# Patient Record
Sex: Female | Born: 1970 | Race: Black or African American | Hispanic: No | Marital: Married | Smoking: Never smoker
Health system: Southern US, Community
[De-identification: ages and names within clinical notes are randomized; demographics above are authoritative.]

## PROBLEM LIST (undated history)

## (undated) DIAGNOSIS — E669 Obesity, unspecified: Secondary | ICD-10-CM

## (undated) DIAGNOSIS — F419 Anxiety disorder, unspecified: Secondary | ICD-10-CM

## (undated) DIAGNOSIS — G43909 Migraine, unspecified, not intractable, without status migrainosus: Secondary | ICD-10-CM

## (undated) HISTORY — DX: Anxiety disorder, unspecified: F41.9

## (undated) HISTORY — DX: Obesity, unspecified: E66.9

## (undated) HISTORY — DX: Migraine, unspecified, not intractable, without status migrainosus: G43.909

---

## 1997-05-30 ENCOUNTER — Inpatient Hospital Stay (HOSPITAL_COMMUNITY): Admission: AD | Admit: 1997-05-30 | Discharge: 1997-05-30 | Payer: Self-pay | Admitting: Obstetrics and Gynecology

## 1997-06-09 ENCOUNTER — Inpatient Hospital Stay (HOSPITAL_COMMUNITY): Admission: AD | Admit: 1997-06-09 | Discharge: 1997-06-10 | Payer: Self-pay | Admitting: Obstetrics and Gynecology

## 1998-05-24 ENCOUNTER — Emergency Department (HOSPITAL_COMMUNITY): Admission: EM | Admit: 1998-05-24 | Discharge: 1998-05-24 | Payer: Self-pay | Admitting: Emergency Medicine

## 1998-06-20 ENCOUNTER — Other Ambulatory Visit: Admission: RE | Admit: 1998-06-20 | Discharge: 1998-06-20 | Payer: Self-pay | Admitting: Obstetrics and Gynecology

## 1998-10-25 ENCOUNTER — Other Ambulatory Visit: Admission: RE | Admit: 1998-10-25 | Discharge: 1998-10-25 | Payer: Self-pay | Admitting: Obstetrics and Gynecology

## 1999-08-29 ENCOUNTER — Other Ambulatory Visit: Admission: RE | Admit: 1999-08-29 | Discharge: 1999-08-29 | Payer: Self-pay | Admitting: Obstetrics and Gynecology

## 2000-08-25 ENCOUNTER — Ambulatory Visit (HOSPITAL_COMMUNITY): Admission: RE | Admit: 2000-08-25 | Discharge: 2000-08-25 | Payer: Self-pay | Admitting: Obstetrics and Gynecology

## 2000-08-25 ENCOUNTER — Encounter (INDEPENDENT_AMBULATORY_CARE_PROVIDER_SITE_OTHER): Payer: Self-pay

## 2001-09-08 ENCOUNTER — Ambulatory Visit (HOSPITAL_COMMUNITY): Admission: RE | Admit: 2001-09-08 | Discharge: 2001-09-08 | Payer: Self-pay | Admitting: Obstetrics and Gynecology

## 2001-09-08 ENCOUNTER — Encounter: Payer: Self-pay | Admitting: Obstetrics and Gynecology

## 2001-09-20 ENCOUNTER — Other Ambulatory Visit: Admission: RE | Admit: 2001-09-20 | Discharge: 2001-09-20 | Payer: Self-pay | Admitting: Obstetrics and Gynecology

## 2002-07-05 ENCOUNTER — Inpatient Hospital Stay (HOSPITAL_COMMUNITY): Admission: AD | Admit: 2002-07-05 | Discharge: 2002-07-05 | Payer: Self-pay | Admitting: Obstetrics and Gynecology

## 2002-07-24 ENCOUNTER — Emergency Department (HOSPITAL_COMMUNITY): Admission: EM | Admit: 2002-07-24 | Discharge: 2002-07-24 | Payer: Self-pay | Admitting: Emergency Medicine

## 2002-08-01 ENCOUNTER — Inpatient Hospital Stay (HOSPITAL_COMMUNITY): Admission: AD | Admit: 2002-08-01 | Discharge: 2002-08-01 | Payer: Self-pay | Admitting: *Deleted

## 2002-10-24 ENCOUNTER — Other Ambulatory Visit: Admission: RE | Admit: 2002-10-24 | Discharge: 2002-10-24 | Payer: Self-pay | Admitting: Obstetrics and Gynecology

## 2002-11-03 ENCOUNTER — Inpatient Hospital Stay (HOSPITAL_COMMUNITY): Admission: AD | Admit: 2002-11-03 | Discharge: 2002-11-03 | Payer: Self-pay | Admitting: Obstetrics and Gynecology

## 2003-01-15 ENCOUNTER — Inpatient Hospital Stay (HOSPITAL_COMMUNITY): Admission: AD | Admit: 2003-01-15 | Discharge: 2003-01-15 | Payer: Self-pay | Admitting: Obstetrics and Gynecology

## 2003-02-16 ENCOUNTER — Inpatient Hospital Stay (HOSPITAL_COMMUNITY): Admission: AD | Admit: 2003-02-16 | Discharge: 2003-02-16 | Payer: Self-pay | Admitting: Obstetrics and Gynecology

## 2003-03-26 ENCOUNTER — Inpatient Hospital Stay (HOSPITAL_COMMUNITY): Admission: AD | Admit: 2003-03-26 | Discharge: 2003-04-02 | Payer: Self-pay | Admitting: *Deleted

## 2003-04-01 ENCOUNTER — Encounter (INDEPENDENT_AMBULATORY_CARE_PROVIDER_SITE_OTHER): Payer: Self-pay | Admitting: *Deleted

## 2003-04-03 ENCOUNTER — Encounter: Admission: RE | Admit: 2003-04-03 | Discharge: 2003-05-03 | Payer: Self-pay | Admitting: Obstetrics and Gynecology

## 2003-04-28 HISTORY — PX: VAGINAL HYSTERECTOMY: SUR661

## 2003-05-02 ENCOUNTER — Other Ambulatory Visit: Admission: RE | Admit: 2003-05-02 | Discharge: 2003-05-02 | Payer: Self-pay | Admitting: Obstetrics and Gynecology

## 2003-05-04 ENCOUNTER — Encounter: Admission: RE | Admit: 2003-05-04 | Discharge: 2003-06-03 | Payer: Self-pay | Admitting: Obstetrics and Gynecology

## 2003-06-04 ENCOUNTER — Encounter: Admission: RE | Admit: 2003-06-04 | Discharge: 2003-07-04 | Payer: Self-pay | Admitting: Obstetrics and Gynecology

## 2003-11-17 ENCOUNTER — Inpatient Hospital Stay (HOSPITAL_COMMUNITY): Admission: AD | Admit: 2003-11-17 | Discharge: 2003-11-17 | Payer: Self-pay | Admitting: Obstetrics & Gynecology

## 2004-02-05 ENCOUNTER — Observation Stay (HOSPITAL_COMMUNITY): Admission: RE | Admit: 2004-02-05 | Discharge: 2004-02-06 | Payer: Self-pay | Admitting: Obstetrics and Gynecology

## 2004-02-05 ENCOUNTER — Encounter (INDEPENDENT_AMBULATORY_CARE_PROVIDER_SITE_OTHER): Payer: Self-pay | Admitting: Specialist

## 2004-08-01 ENCOUNTER — Emergency Department (HOSPITAL_COMMUNITY): Admission: EM | Admit: 2004-08-01 | Discharge: 2004-08-01 | Payer: Self-pay | Admitting: Emergency Medicine

## 2004-09-18 ENCOUNTER — Emergency Department (HOSPITAL_COMMUNITY): Admission: EM | Admit: 2004-09-18 | Discharge: 2004-09-18 | Payer: Self-pay | Admitting: Emergency Medicine

## 2005-03-09 ENCOUNTER — Other Ambulatory Visit: Admission: RE | Admit: 2005-03-09 | Discharge: 2005-03-09 | Payer: Self-pay | Admitting: Obstetrics and Gynecology

## 2005-12-04 ENCOUNTER — Encounter: Admission: RE | Admit: 2005-12-04 | Discharge: 2005-12-04 | Payer: Self-pay | Admitting: Obstetrics and Gynecology

## 2005-12-04 IMAGING — MG MM DIAGNOSTIC BILATERAL
2 series · 2 of 2 positions shown · non-contrast
Comparison: none

DG DIAGNOSTIC BILATERAL
Bilateral CC and MLO view(s) were taken.

DIGITAL BILATERAL  DIAGNOSTIC MAMMOGRAM WITH CAD:
CLINICAL DATA: 34-year-old with soreness in the right breast.  This is diffuse pain involving the
lower outer quadrant of the right breast.
Baseline mammogram shows fibroglandular parenchymal pattern.  No mass, architectural distortion or 
suspicious microcalcification is identified.

[R CC]
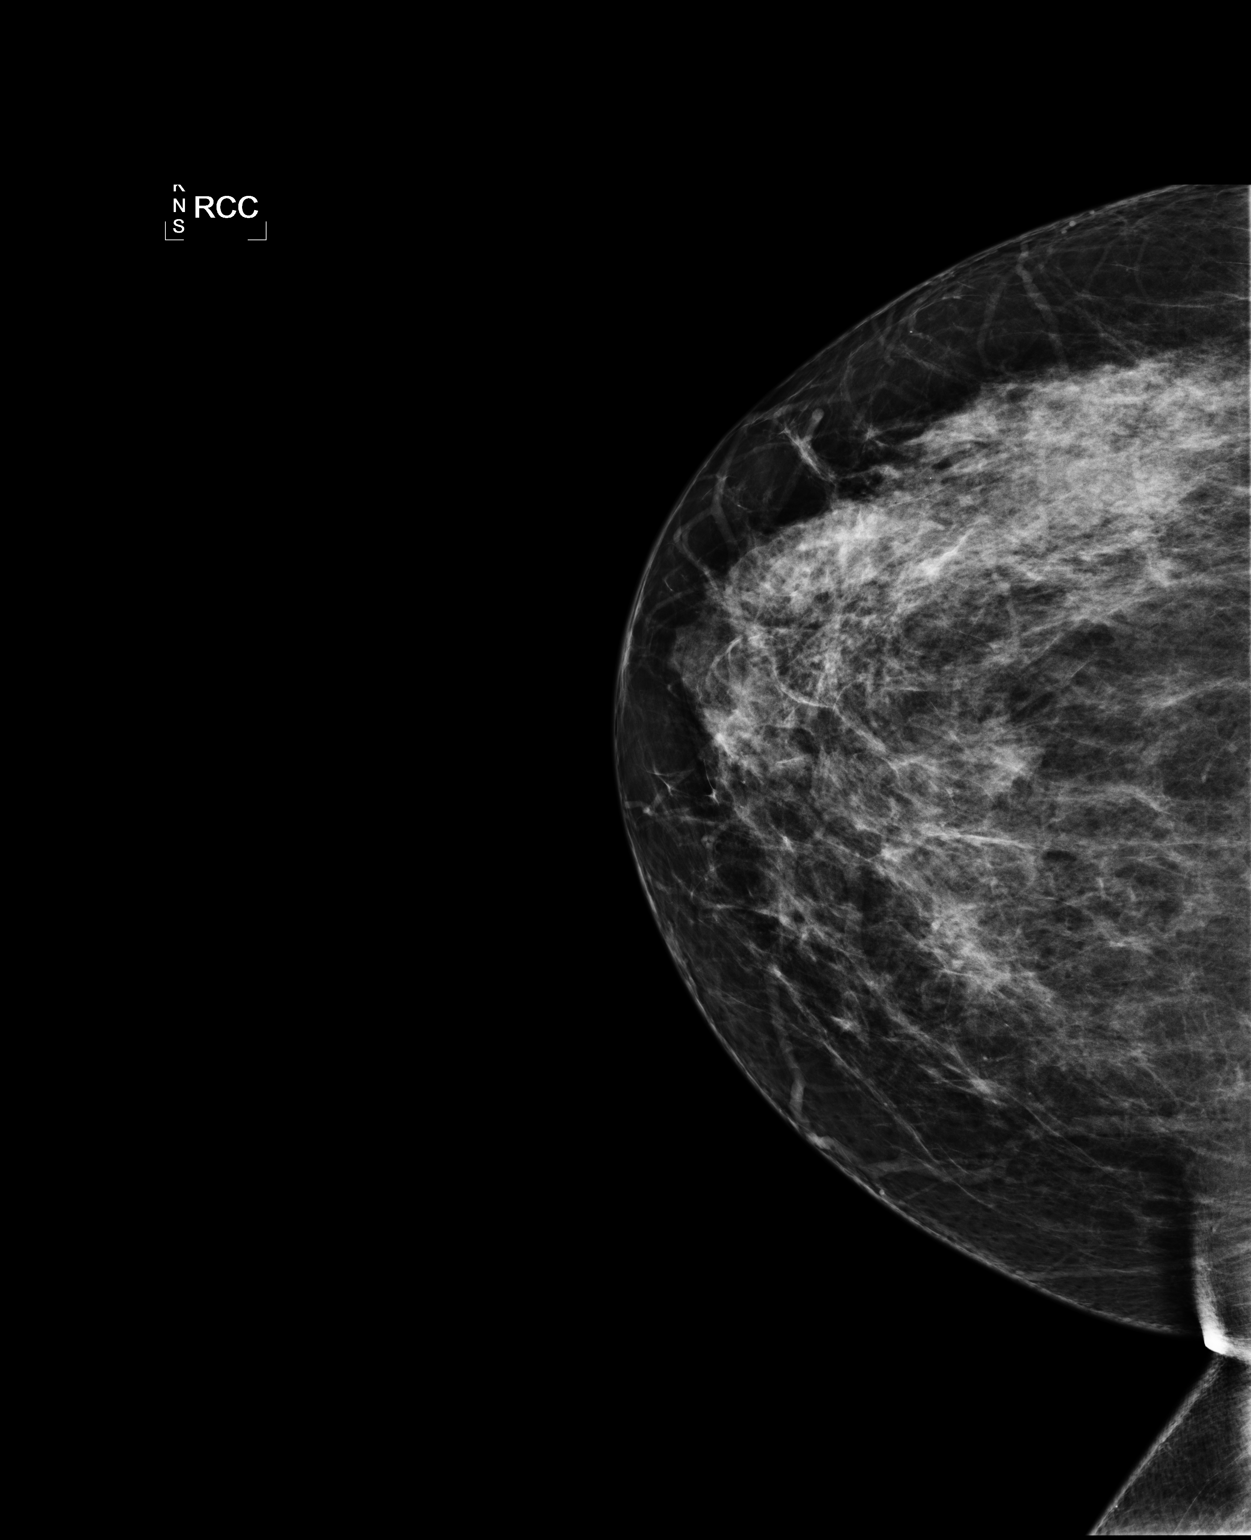

[L MLO]
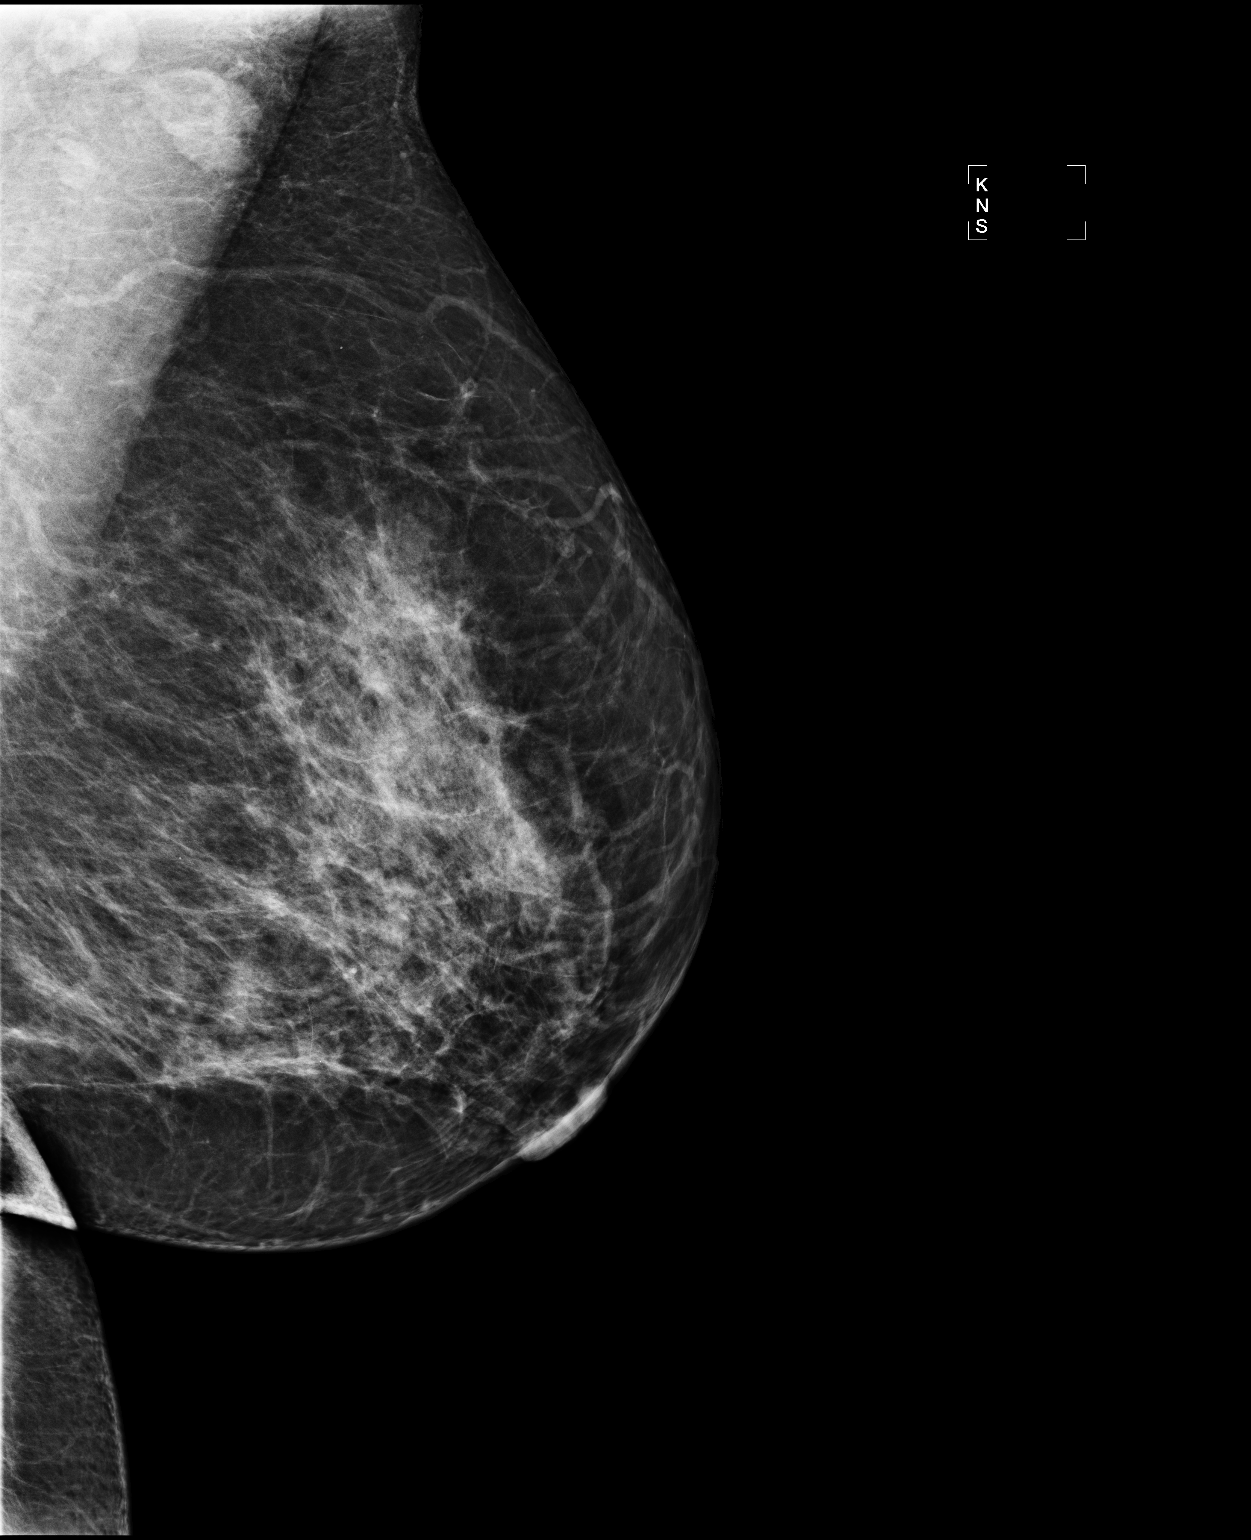

[2 of 2 positions shown; findings below may reference images not displayed]

IMPRESSION: No mammographic evidence for malignancy.  Annual mammography is recommended beginning at age 40.

ASSESSMENT: Negative - BI-RADS 1

Routine screening mammogram at age 39.
, THIS PROCEDURE WAS A DIGITAL MAMMOGRAM

## 2005-12-19 ENCOUNTER — Encounter: Admission: RE | Admit: 2005-12-19 | Discharge: 2005-12-19 | Payer: Self-pay | Admitting: Obstetrics and Gynecology

## 2006-05-26 ENCOUNTER — Encounter: Admission: RE | Admit: 2006-05-26 | Discharge: 2006-05-26 | Payer: Self-pay | Admitting: Obstetrics and Gynecology

## 2006-05-26 ENCOUNTER — Encounter (INDEPENDENT_AMBULATORY_CARE_PROVIDER_SITE_OTHER): Payer: Self-pay | Admitting: *Deleted

## 2006-06-15 ENCOUNTER — Emergency Department (HOSPITAL_COMMUNITY): Admission: EM | Admit: 2006-06-15 | Discharge: 2006-06-15 | Payer: Self-pay | Admitting: Emergency Medicine

## 2008-12-21 ENCOUNTER — Encounter: Admission: RE | Admit: 2008-12-21 | Discharge: 2008-12-21 | Payer: Self-pay | Admitting: Family Medicine

## 2009-10-13 ENCOUNTER — Emergency Department (HOSPITAL_COMMUNITY): Admission: EM | Admit: 2009-10-13 | Discharge: 2009-10-13 | Payer: Self-pay | Admitting: Emergency Medicine

## 2010-05-18 ENCOUNTER — Encounter: Payer: Self-pay | Admitting: Obstetrics and Gynecology

## 2010-07-22 ENCOUNTER — Other Ambulatory Visit: Payer: Self-pay | Admitting: Neurology

## 2010-07-22 ENCOUNTER — Ambulatory Visit
Admission: RE | Admit: 2010-07-22 | Discharge: 2010-07-22 | Disposition: A | Discharge status: Home / Self Care | Payer: PRIVATE HEALTH INSURANCE | Source: Ambulatory Visit | Attending: Neurology | Admitting: Neurology

## 2010-07-22 DIAGNOSIS — R51 Headache: Secondary | ICD-10-CM

## 2010-07-22 MED ORDER — GADOBENATE DIMEGLUMINE 529 MG/ML IV SOLN
15.0000 mL | Freq: Once | INTRAVENOUS | Status: AC | PRN
  Administered 2010-07-22: 15 mL via INTRAVENOUS

## 2010-09-12 NOTE — Discharge Summary (Signed)
Shelby Fields, Shelby Fields                             ACCOUNT NO.:  [id number]   MEDICAL RECORD NO.:  [id number]                   PATIENT TYPE:  INP   LOCATION:  9116                                 FACILITY:  [facility]   PHYSICIAN:  Guy Sandifer. Henderson Cloud, M.D.              DATE OF BIRTH:  Mar 16, 1971   DATE OF ADMISSION:  03/26/2003  DATE OF DISCHARGE:  04/02/2003                                 DISCHARGE SUMMARY   ADMITTING DIAGNOSES:  1. Intrauterine pregnancy at 31-5/7 weeks estimated gestational age.  2. Preterm labor.   DISCHARGE DIAGNOSES:  1. Status post spontaneous vaginal delivery.  2. Viable female infant.   PROCEDURES:  1. Spontaneous vaginal delivery.  2. Dilation and curettage.   REASON FOR ADMISSION:  Please see written H&P.   HOSPITAL COURSE:  Patient was a 40 year old gravida 4 para 1 female that was  admitted to [facility] at 31-5/7 weeks in preterm labor.  Patient was seen in the maternity admissions unit for increase in uterine  contractions which were every 4-5 minutes with change in cervix to 1 cm  dilated.  Terbutaline subcutaneously was administered with oral hydration  which did not change the uterine contraction pattern.  Patient was then  admitted for preterm labor management.  She did have a history of positive  group B beta strep.  Patient was then admitted and magnesium sulfate was  started for tocolysis.  Betamethasone was also administered to enhance fetal  lung maturity.  IV antibiotics were administered, fetal heart tones were  reactive, and magnesium sulfate was continued.  Biophysical profile was  performed which revealed score of 8/8, cervical length was noted to be 3.7  cm, magnesium sulfate was tapered and later that evening spontaneous rupture  of the membranes did occur with clear fluid, fetal heart tones were 140,  baby was noted to be in the vertex presentation, fetal heart tones were  reactive.  Betamethasone was continued and magnesium  sulfate was  discontinued the following morning, IV antibiotics were continued, patient  continued to leak small amount of fluid, she remained afebrile, fetal heart  tones were reactive.  On hospital day #3 patient did notice some increasing  contractions, she was continuing to leak a small amount of fluid,  temperature was noted to be 99.1, however, CBC revealed WBC count of 8.4,  fetal heart tones were reactive, however, there were some small variables  that were noted with contraction, patient was monitored closely for evidence  of chorioamnionitis.  IV antibiotics were continued.  On March 31, 2003  patient did notice some increasing contractions which were noted to be every  4-8 minutes, vaginal exam revealed cervix that was dilated to 4-5 cm,  completely effaced with vertex at a -3 station, and fetal heart tones were  140s with nice acceleration.  That afternoon patient did progress to full  dilation with spontaneous vaginal delivery  at 1:11 p.m., a viable female  infant over an intact perineum, baby did weigh 4 pounds 3 ounces with Apgars  of 7 at one minute, 8 at five minutes.  NICU was in attendance and baby did  well and was taken to the nursery with pediatric team.  Placenta was very  adherent to the uterine wall and was unable to be removed spontaneously,  attempts were made for a manual extraction but it was difficult due to  numerous fibroids, subsequent D&C was performed, patient did tolerate  procedure well and was transferred to the mother-baby unit after she was  stable.  On postpartum day #1 patient was afebrile, fundus was firm and  nontender, she was having a minimal amount of lochia, she was ambulating  well, not complaining of dizziness, hemoglobin was noted to be 5.8, the  patient was started on iron with meals and antibiotics were continued for 24  hours.  On postpartum day #2 patient was without complaints, baby was stable  in the NICU, fundus was firm and  nontender, lochia continued to be small,  vital signs were stable, discharge instructions were reviewed and patient  was discharged home.   CONDITION ON DISCHARGE:  Stable.   DIET:  Regular as tolerated.   ACTIVITY:  No vaginal entry, douches, tampons, or intercourse for 6 weeks.   FOLLOW UP:  She is to follow up in the office in 4-6 weeks for postpartum  exam.  She is to call for temperature greater than 100 degrees, nausea,  vomiting, heavy vaginal bleeding.   DISCHARGE MEDICATIONS:  1. Niferex 150 mg one p.o. daily.  2. Prenatal vitamins one p.o. daily.  3. Colace one p.o. daily p.r.n.  4. Ibuprofen 600 mg every 6 hours p.r.n.  5. Darvocet-N 100 (#20) one p.o. every 4-6 hours p.r.n. pain.     Julio Sicks, N.P.                        Guy Sandifer. Henderson Cloud, M.D.    CC/MEDQ  D:  05/01/2003  T:  05/01/2003  Job:  [id number]

## 2010-09-12 NOTE — H&P (Signed)
[facility]  Patient:    Shelby Fields, Shelby Fields                       MRN: [id number] Adm. Date:  08/25/00 Attending:  Duke Salvia. Marcelle Overlie, M.D.                         History and Physical  CHIEF COMPLAINT:  Menorrhagia and anemia, leiomyomata.  HISTORY OF PRESENT ILLNESS:  A 40 year old G67, P1, A2, who was seen in December 2001 with complaints of heavy irregular bleeding.  She was felt to have leiomyomata at that time and was started on iron along with OCPs to try to manage her bleeding.  Ultrasound done December 2001 confirmed fibroids and follow-up [facility] July 09, 2000, showed a submucous fibroid that appeared to be somewhat pedunculated.  Presents now for hysteroscopy with hysteroscopic resection of the fibroid.  Her hemoglobin has been in the 7.7 range on Hemocyte b.i.d., and she continues to take her OCPs.  PAST MEDICAL HISTORY:  ALLERGIES:  None.  MEDICATIONS:  Yasmin, Hemocyte b.i.d.  OBSTETRICAL HISTORY:  She has had one FAB, one TAB, and a vaginal delivery in 1996.  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  Cryo in 1993.  The last Pap smear May 2001 was normal.  She has not had a mammogram in the past.  PHYSICAL EXAMINATION:  VITAL SIGNS:  Temperature 98.2, blood pressure 120/78.  HEENT:  Unremarkable.  NECK:  Supple without mass.  LUNGS:  Clear.  CARDIOVASCULAR:  Regular rate and rhythm without murmur, rub or gallop.  BREASTS:  Without masses.  ABDOMEN:  Soft, flat, and nontender.  PELVIC:  Vagina and cervix are normal.  Uterus is 10 week size, mid position. Adnexa unremarkable.  IMPRESSION:  Anemia with submucous leiomyomata.  PLAN:  Hysteroscopy with resection of a submucous fibroid.  This procedure including risks of bleeding, infection, anesthesia, organ injury, the possible need for additional surgery or open surgery were discussed with her.  The possibility of transfusion reviewed with her also. DD:   08/24/00 TD:  08/24/00 Job: [id number]

## 2010-09-12 NOTE — Op Note (Signed)
[facility]  Patient:    Shelby Fields, Shelby Fields                        MRN: [id number] Proc. Date: 08/25/00 Adm. Date:  [id number] Disc. Date: [id number] Attending:  Rhina Brackett                           Operative Report  PREOPERATIVE DIAGNOSES: 1. Anemia. 2. Leiomyoma. 3. Abnormal uterine bleeding.  POSTOPERATIVE DIAGNOSES: 1. Anemia. 2. Leiomyoma. 3. Abnormal uterine bleeding.  PROCEDURE:  Diagnostic hysteroscopy, removal of submucous fibroid.  SURGEON:  Duke Salvia. Marcelle Overlie, M.D.  ANESTHESIA:  MAC plus paracervical block.  DESCRIPTION OF PROCEDURE:  The patient was taken to the operating room and after an adequate level of sedation was obtained with the patients legs in stirrups, the perineum and vagina were prepped and draped in the usual manner for hysteroscopy.  The bladder was drained.  Examination under anesthesia was carried out.  The uterus was upper limits of normal size and midposition. Adnexa negative.  Paracervical block was created by infiltrating at 3 and 9 oclock submucosally, 5-7 cc of 1% Xylocaine plain on either side with negative aspiration.  The anterior cervical lip was grasped with the tenaculum.  A portion of a polyp or fibroid could be noted at the os with removal of Laminaria and adequate prep.  This was grasped with a ring forceps and 2.5 to 3.0 cm submucous fibroid was avulsed without difficulty.  The diagnostic hysteroscope under continuous flow was inserted.  There was minimal bleeding.  There were no other abnormalities noted.  A D&C was performed. Minimal tissue was removed.  The scope was reinserted.  No other abnormalities were noted in the cavity.  A _____ cc Foley balloon was positioned and 10-15 cc of saline were placed to provide immediate tamponade even though there was minimal bleeding at that point.  She tolerated this well and went to the recovery room in good condition. DD:  10/12/00 TD:  10/12/00 Job:  [id number]

## 2010-09-12 NOTE — Discharge Summary (Signed)
NAMESIBONEY, REQUEJO                 ACCOUNT NO.:  [id number]   MEDICAL RECORD NO.:  [id number]          PATIENT TYPE:  OBV   LOCATION:  9305                          FACILITY:  [facility]   PHYSICIAN:  Duke Salvia. Marcelle Overlie, M.D.DATE OF BIRTH:  05/12/1970   DATE OF ADMISSION:  02/05/2004  DATE OF DISCHARGE:                                 DISCHARGE SUMMARY   DISCHARGE DIAGNOSES:  1.  Symptomatic leiomyoma.  2.  Laparoscopic-assisted vaginal hysterectomy this admission.   SUMMARY OF THE HISTORY AND PHYSICAL EXAMINATION:  Please see admission H&P  for details.  Briefly, a 40 year old with symptomatic leiomyoma who presents  for hysterectomy secondary to pain and bleeding.   HOSPITAL COURSE:  On February 05, 2004 under general anesthesia the patient  underwent LAVH, removal of a 260 g uterus with an EBL of 350 mL.  Her  ovaries were normal and were conserved.  On postoperative day #1, she  remained afebrile, was tolerating a regular diet.  The catheter had been  removed and she was voiding without difficulty.  Preoperative hemoglobin  11.8, postoperative on October 12 was 9.3.  She was discharged at  approximately 1 p.m., up ambulating without difficulty.   OTHER LABORATORY DATA:  Preoperative CBC:  WBC 5.8, hemoglobin 11.8,  hematocrit 36.5.  Postoperative hemoglobin 9.3.  UA was negative on  admission.  Blood type was A positive, antibody screen was negative.  Pathology report is still pending.   DISPOSITION:  The patient was discharged on Hemocyte once daily and Lortab  elixir 5-15 mL p.o. q.4-6h. p.r.n. pain.  Will return to the office in 1  week.  Advised to report any increased vaginal bleeding, fever over 101,  persistent nausea/vomiting, or urinary complaints.  She was given specific  instructions regarding diet, sex, exercise.   CONDITION:  Good.   ACTIVITY:  Graded increase.      RMH/MEDQ  D:  02/06/2004  T:  02/06/2004  Job:  [id number]

## 2010-09-12 NOTE — Op Note (Signed)
NAMEDEZIRE, TURK                             ACCOUNT NO.:  [id number]   MEDICAL RECORD NO.:  [id number]                   PATIENT TYPE:  INP   LOCATION:  9116                                 [facility]ACILITY:  [facility]   PHYSICIAN:  Dineen Kid. Rana Snare, M.D.                 DATE OF BIRTH:  [birth year]   DATE OF PROCEDURE:  03/31/2003  DATE OF DISCHARGE:                                 OPERATIVE REPORT   DELIVERY AND PROCEDURE NOTE:  Ms. Deveney had presented with preterm labor and  premature rupture of membranes.  She was 32-1/[redacted] weeks pregnant and  progressed to complete and delivered a viable female infant, Apgars 7 and 8.  The NICU team was present.  The cord was clamped, cut, and the infant was  resuscitated by the NICU team and taken to the NICU.  No lacerations were  apparent.  The placenta was very adherent and unable to remove  spontaneously.  Attempts at manual extraction were very difficult due to the  numerous fibroids.  It was dissected with the operator's hands and ring  forceps in fragmented pieces.  This was followed by sharp curettage with the  banjo curette, sounding a normal-appearing endometrium without retained  products of conception, however very difficult to ascertain due to the  numerous fibroids contorting the endometrium.  After it was felt the uterus  was empty by manual exam and also by sounding with a curette, the curette  was removed.  The bleeding was noted to be minimal.  The uterus was massaged  until firm.  Estimated blood loss during the procedure was 600-800 mL.  The  Apgars on the infant were 7 and 8, and the pH arterial is currently pending.                                               Dineen Kid Rana Snare, M.D.    DCL/MEDQ  D:  03/31/2003  T:  03/31/2003  Job:  [id number]

## 2010-09-12 NOTE — H&P (Signed)
NAMETWALA, COLLINGS                 ACCOUNT NO.:  [id number]   MEDICAL RECORD NO.:  [id number]          PATIENT TYPE:  AMB   LOCATION:  SDC                           [facility]ACILITY:  [facility]   PHYSICIAN:  Duke Salvia. Marcelle Overlie, M.D.DATE OF BIRTH:  1970/12/25   DATE OF ADMISSION:  DATE OF DISCHARGE:                                HISTORY & PHYSICAL   DATE OF SCHEDULED SURGERY:  February 05, 2004   CHIEF COMPLAINT:  Symptomatic leiomyoma with menorrhagia and dysmenorrhea.   HISTORY OF PRESENT ILLNESS:  A 40 year old G4 P2 A2 with symptomatic  fibroids primarily consisting of menorrhagia with anemia and dysmenorrhea  presents for a definitive hysterectomy.  She delivered December 2004  prematurely a 4-pound 3-ounce female.  Postpartum, she was placed on OCs but  continued to have problems with anemia, heavy bleeding, and pelvic pain  requiring narcotics, and presents now for definitive hysterectomy.  The  procedure of LAVH discussed with her.  Risks relative to bleeding,  infection, transfusion, the possible need for open or additional surgery,  along with her expected recovery time all reviewed with her, which she  understands and accepts.   PAST MEDICAL HISTORY:  Blood type is A positive.   OBSTETRICAL HISTORY:  She has had one TAB, one SAB, one term delivery in  1996, and one preterm delivery in 2004.   ALLERGIES:  None.   REVIEW OF SYSTEMS:  Significant for cryosurgery in the past for an abnormal  Pap, removal of a breast fibroadenoma at age 43, hysteroscopic resection of  a submucous fibroid in 2002.   PHYSICAL EXAMINATION:  VITAL SIGNS:  Temperature 98.2, blood pressure  120/78.  HEENT:  Unremarkable.  NECK:  Supple without masses.  LUNGS:  Clear.  CARDIOVASCULAR:  Regular rate and rhythm without murmurs, rubs, gallops  noted.  BREASTS:  Without masses.  ABDOMEN:  Soft, flat, and nontender.  PELVIC:  Normal external genitalia, vagina and cervix clear.  Uterus was 6-  to 8-week size,  mid position.  Adnexa negative.  EXTREMITIES AND NEUROLOGIC:  Unremarkable.   Last ultrasound December 11, 2003 showed multiple fibroids. The largest was  5.8 x 5 cm, appeared to be pedunculated.  Several other smaller - 2.8, 2.34,  1.7, and 1.8.   IMPRESSION:  Symptomatic leiomyoma.   PLAN:  LAVH.  Procedure and risks reviewed as above.      RMH/MEDQ  D:  02/01/2004  T:  02/01/2004  Job:  [id number]

## 2010-09-12 NOTE — Op Note (Signed)
Shelby Fields, Shelby Fields                 ACCOUNT NO.:  [id number]   MEDICAL RECORD NO.:  [id number]          PATIENT TYPE:  OBV   LOCATION:  9305                          [facility]ACILITY:  [facility]   PHYSICIAN:  Duke Salvia. Marcelle Overlie, M.D.DATE OF BIRTH:  10/09/1970   DATE OF PROCEDURE:  02/05/2004  DATE OF DISCHARGE:                                 OPERATIVE REPORT   PREOPERATIVE DIAGNOSES:  Symptomatic leiomyoma.   POSTOPERATIVE DIAGNOSES:  Symptomatic leiomyoma.   PROCEDURE:  Laparoscopically assisted vaginal hysterectomy.   SURGEON:  Duke Salvia. Marcelle Overlie, M.D.   ASSISTANT:  Dineen Kid. Rana Snare, M.D.   ANESTHESIA:  General endotracheal.   COMPLICATIONS:  None.   DRAINS:  Foley catheter.   ESTIMATED BLOOD LOSS:  350.   FINDINGS:  The patient was taken to the operating room and after an adequate  level of general endotracheal anesthesia was obtained with the patient's  legs in stirrups, the abdomen, perineum and vagina were prepped and draped  in the usual manner for LAVH.  After the bladder was drained, EUA carried  out revealing the uterus to be 10 week size, mobile, irregular to the left.  Attention direct to the abdomen, the subumbilical area was infiltrated with  0.5% Marcaine plain. A small incision was made, Veress needle was introduced  without difficulty. Its intraabdominal position was verified by pressure and  water testing. After a 2.5 liter pneumoperitoneum was then created, the  laparoscopic trocar and sleeve were then introduced without difficulty.  There was no evidence of any bleeding or trauma.  Two fingerbreadths below  the symphysis in the midline, a 5 mm bladeless trocar was inserted under  direct visualization. A third 5 mm after negative transillumination was  inserted into the left lower quadrant.   FINDINGS:  The uterus was itself was enlarged approximately 10 week size  with several irregular fibroids, adnexa unremarkable on either side. There  was a pedunculated 5 cm left  fundal fibroid with some adhesions between the  sigmoid mesentery and the fibroid. There was no bowel involved in this area.  After noting these findings, a grasper with a tooth was used to grasp the  pedunculated fibroid which was placed on traction toward the midline.  Careful inspection of the mesenteric adhesions revealed no bowel in this  area. The gyrus tripolar was then used to coagulate and cut the omentum free  from the pedunculated fibroid right on the surface of the fibroid. When this  was free up it was left to its left fundal attachment to remove vaginally.  The gyrus was then used to coagulate and cut the uteroovarian pedicles down  to and including the round ligament on either side with excellent  hemostasis.  After this was completed, the vagina portion of the procedure  was started.   The patient's legs were extended, cervix grasped with a tenaculum,  cervicovaginal mucosa with ties with a scalpel. Posterior culdotomy  performed without difficulty. Starting on the left, the uterosacral ligament  was clamped, divided and suture ligated with #0 Vicryl and held temporarily  for identification. The bladder was  advanced superiorly with sharp and blunt  dissection. The peritoneum could be then identified was entered sharply and  the retractor was positioned to elevate the bladder gently out of the field.  In sequential manner, the cardinal ligament, uterine vasculature pedicles  and upper broad ligament pedicles were clamped, coagulated and cut with the  gyrus instrument. The fundus of the uterus was then delivered posteriorly.  The remaining small pedicles were clamped, divided and free tied with #0  Vicryl sutures. The uterus with the attached pedunculated fibroid was then  removed. The cuff was closed with a running locked 2-0 Vicryl suture from 3  to 9 o'clock. All major pedicles were inspected and noted to be hemostatic.  A McCall's culdoplasty suture was then placed from  the uterosacral on the  left picking up posterior peritoneum across to the right uterosacral  ligament and tied down.  Prior to closure, sponge, needle and instrument  counts were reported as correct x2.  The mucosa was then closed with  interrupted 2-0 Monocryl sutures with excellent hemostasis. The bladder was  drained at that point with a Foley draining clear urine. Attention directed  to the laparoscopy, the pelvis was reinsufflated, irrigated and aspirated.  All operative sites were inspected and noted to be hemostatic. Gas pressure  was let down to a pressure of 5 and further inspection revealed excellent  hemostasis. She tolerated this well. The instruments were removed, gas  allowed to escape, deep fascia closed with 4-0 Dexon subcuticular sutures  and Dermabond.      RMH/MEDQ  D:  02/05/2004  T:  02/05/2004  Job:  [id number]

## 2014-12-12 ENCOUNTER — Other Ambulatory Visit: Payer: Self-pay | Admitting: Obstetrics and Gynecology

## 2014-12-13 LAB — CYTOLOGY - PAP

## 2016-04-08 ENCOUNTER — Ambulatory Visit (INDEPENDENT_AMBULATORY_CARE_PROVIDER_SITE_OTHER): Payer: PRIVATE HEALTH INSURANCE | Admitting: Neurology

## 2016-04-08 ENCOUNTER — Encounter: Payer: Self-pay | Admitting: Neurology

## 2016-04-08 VITALS — BP 143/94 | HR 83 | Ht 61.5 in | Wt 197.8 lb

## 2016-04-08 DIAGNOSIS — R930 Abnormal findings on diagnostic imaging of skull and head, not elsewhere classified: Secondary | ICD-10-CM

## 2016-04-08 DIAGNOSIS — R9082 White matter disease, unspecified: Secondary | ICD-10-CM

## 2016-04-08 DIAGNOSIS — G43009 Migraine without aura, not intractable, without status migrainosus: Secondary | ICD-10-CM

## 2016-04-08 MED ORDER — PROPRANOLOL HCL ER 60 MG PO CP24: 60.0000 mg | ORAL_CAPSULE | Freq: Every day | ORAL | 6 refills | Status: DC

## 2016-04-08 NOTE — Progress Notes (Signed)
[facility]    Provider:  Dr Jaynee Eagles Referring Provider: Michel Harrow, PA-C Primary Care Physician:   Michel Harrow, PA-C  CC:  Worsening headaches  HPI:  Shelby Fields is a 45 y.o. female here as a referral from Dr. Dimas Aguas for headaches. She has a Hx of migraines. Sjhe is on topamax and effexor. Recently worsened. Within the last month worsening, no inciting events to worsening. Headaches are pounding in the forehead across the front. She has had daily headaches in the setting of elevated blood pressure ([id number] systolic) but may be due to the headache pain as opposed to hypertension. The headaches develop later in the day. She has nausea, no vomiting. She has had light sensitivity. Pounding and throbbing can be severe a 10/10 she has had to leave work and get a toradol injection and phenergan. One migraine can last all day. She tried imitrex in the past and it made her sick. Can't remember if it worked. She has gained a lot of weight. She has dizziness and lightheadedness associated with the headaches. She snores heavily. Previous to this month was having 4 headache days a month, so this month is significantly worsened. No new medications, no changes in sleeping or work habits, no known food or other triggers, ibuprofen helps but does not relieve the headache. Quiet room helps. No vision changes. No aura. No other focal neurologic deficits, associated symptoms, modifiable factors.  Reviewed notes, labs and imaging from outside physicians, which showed: Reviewed notes from Dr. love who is a neurologist she saw in 2012. At that time she was seen for headaches. In 2007 and MRI of the brain showed a single white matter lesion in the right parietal region which did not enhance, neurologic exam was normal but lesion was concerning for remote embolic stroke or possibly a demyelinating disorder. RPR, sedimentation rate and carotid study of the Dopplers were negative in 2012. At the  time she was complaining of numbness in her left hand and in her left foot. Her right side was normal. She was having headaches in the posterior head region and neck pain 5 days per week. She describes the pain as 7 out of 10 in severity. She noted nausea without vomiting with her headaches. No visual changes with the headaches. She denied Lhermitte sign, bowel or bladder dysfunction, or visual disturbances. Repeat MRI of the brain continued to show the abnormality measuring 6.6 mm in the right posterior frontal lobe, bilateral maxillary sinuses, and hypoplastic posterior circulation disease normal variant. In addition to the headache pain she had dizzy spells lasting up to 5 minutes occurring as often as 2 times per week mostly in the sitting position described as spinning without nausea and vomiting.  Personally reviewed MRI of the brain imaging, there is a 6x6 millimeter right posterior frontal, subcortical focus of T2 FLAIR hyperintensity. It did not enhance. Considerations include autoimmune, inflammatory, postinfectious, chronic ischemic, migraine associated or idiopathic etiology.  Review of Systems: Patient complains of symptoms per HPI as well as the following symptoms:no cp, no sob. Endorses headache, numbness, shortness of breath. Pertinent negatives per HPI. All others negative.   Social History   Social History  . Marital status: Married    Spouse name: Berneta Sages  . Number of children: 2  . Years of education: 14   Occupational History  . Nurse- LPN    Social History Main Topics  . Smoking status: Never Smoker  . Smokeless tobacco: Never Used  . Alcohol  use Yes     Comment: Socially  . Drug use: No  . Sexual activity: Not on file   Other Topics Concern  . Not on file   Social History Narrative   Lives with husband and 2 kids   Caffeine use: Tea/soda- occass       Family History  Problem Relation Age of Onset  . Anemia Mother   . Bladder Cancer Father   . Asthma  Brother   . Ovarian cancer Paternal Grandmother     Past Medical History:  Diagnosis Date  . Anxiety   . Migraine   . Obesity     Past Surgical History:  Procedure Laterality Date  . VAGINAL HYSTERECTOMY  2005   Still has ovaries    Current Outpatient Prescriptions  Medication Sig Dispense Refill  . Cholecalciferol (VITAMIN D PO) Take 50,000 Units by mouth once a week.    Marland Kitchen ibuprofen (ADVIL,MOTRIN) 800 MG tablet Take 800 mg by mouth 3 (three) times daily as needed.    . Multiple Vitamins-Minerals (MULTIVITAMIN PO) Take 1 tablet by mouth daily.    . ondansetron (ZOFRAN) 4 MG tablet Take 4 mg by mouth every 8 (eight) hours as needed for nausea or vomiting.    . topiramate (TOPAMAX) 100 MG tablet Take 100 mg by mouth daily.    Marland Kitchen venlafaxine XR (EFFEXOR-XR) 150 MG 24 hr capsule Take 150 mg by mouth daily with breakfast.    . propranolol ER (INDERAL LA) 60 MG 24 hr capsule Take 1 capsule (60 mg total) by mouth at bedtime. 30 capsule 6   No current facility-administered medications for this visit.     Allergies as of 04/08/2016  . (No Known Allergies)    Vitals: BP (!) 143/94 (BP Location: Right Arm, Patient Position: Sitting, Cuff Size: Large)   Pulse 83   Ht 5' 1.5" (1.562 m)   Wt 197 lb 12.8 oz (89.7 kg)   BMI 36.77 kg/m  Last Weight:  Wt Readings from Last 1 Encounters:  04/08/16 197 lb 12.8 oz (89.7 kg)   Last Height:   Ht Readings from Last 1 Encounters:  04/08/16 5' 1.5" (1.562 m)   Physical exam: Exam: Gen: NAD, conversant, well nourised, obese, well groomed                     CV: RRR, no MRG. No Carotid Bruits. No peripheral edema, warm, nontender Eyes: Conjunctivae clear without exudates or hemorrhage  Neuro: Detailed Neurologic Exam  Speech:    Speech is normal; fluent and spontaneous with normal comprehension.  Cognition:    The patient is oriented to person, place, and time;     recent and remote memory intact;     language fluent;     normal  attention, concentration,     fund of knowledge Cranial Nerves:    The pupils are equal, round, and reactive to light. The fundi are normal and spontaneous venous pulsations are present. Visual fields are full to finger confrontation. Extraocular movements are intact. Trigeminal sensation is intact and the muscles of mastication are normal. The face is symmetric. The palate elevates in the midline. Hearing intact. Voice is normal. Shoulder shrug is normal. The tongue has normal motion without fasciculations.   Coordination:    Normal finger to nose and heel to shin. Normal rapid alternating movements.   Gait:    Heel-toe and tandem gait are normal.   Motor Observation:    No asymmetry, no  atrophy, and no involuntary movements noted. Tone:    Normal muscle tone.    Posture:    Posture is normal. normal erect    Strength:    Strength is V/V in the upper and lower limbs.      Sensation: intact to LT     Reflex Exam:  DTR's:    Deep tendon reflexes in the upper and lower extremities are normal bilaterally.   Toes:    The toes are downgoing bilaterally.   Clonus:    Clonus is absent.       Assessment/Plan:  45 year old female with chronic migraine disorder and worsening headaches over the last month with have been severe. 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 Patient has not had reevaluation since 2012. Feel we should reevaluate MRI of the brain to follow this lesion and ensure that there has been no worsening or enlargement.  - start propranolol for migraines. Continue Topamax and Effexor. Zofran for nausea. BMP today.  - repeat MRI of the brain, see above for differential - Discussed: To prevent or relieve headaches, try the following: Cool Compress. Lie down and place a cool compress on your head.  Avoid headache triggers. If certain foods or odors seem  to have triggered your migraines in the past, avoid them. A headache diary might help you identify triggers.  Include physical activity in your daily routine. Try a daily walk or other moderate aerobic exercise.  Manage stress. Find healthy ways to cope with the stressors, such as delegating tasks on your to-do list.  Practice relaxation techniques. Try deep breathing, yoga, massage and visualization.  Eat regularly. Eating regularly scheduled meals and maintaining a healthy diet might help prevent headaches. Also, drink plenty of fluids.  Follow a regular sleep schedule. Sleep deprivation might contribute to headaches Consider biofeedback. With this mind-body technique, you learn to control certain bodily functions - such as muscle tension, heart rate and blood pressure - to prevent headaches or reduce headache pain.    Proceed to emergency room if you experience new or worsening symptoms or symptoms do not resolve, if you have new neurologic symptoms or if headache is severe, or for any concerning symptom.   Cc: v  Sarina Ill, MD  [facility] [address] [place], [place] [address]  Phone [phone] Fax [phone]

## 2016-04-08 NOTE — Patient Instructions (Addendum)
Remember to drink plenty of fluid, eat healthy meals and do not skip any meals. Try to eat protein with a every meal and eat a healthy snack such as fruit or nuts in between meals. Try to keep a regular sleep-wake schedule and try to exercise daily, particularly in the form of walking, 20-30 minutes a day, if you can.   As far as your medications are concerned, I would like to suggest: Propranolol 60mg  at night  As far as diagnostic testing: MRI brain w/wo contrast   Our phone number is [phone]. We also have an after hours call service for urgent matters and there is a physician on-call for urgent questions. For any emergencies you know to call 911 or go to the nearest emergency room  Propranolol extended-release capsules What is this medicine? PROPRANOLOL (proe PRAN oh lole) is a beta-blocker. Beta-blockers reduce the workload on the heart and help it to beat more regularly. This medicine is used to treat high blood pressure, heart muscle disease, and prevent chest pain caused by angina. It is also used to prevent migraine headaches. You should not use this medicine to treat a migraine that has already started. This medicine may be used for other purposes; ask your health care provider or pharmacist if you have questions. COMMON BRAND NAME(S): Inderal LA, Inderal XL, InnoPran XL What should I tell my health care provider before I take this medicine? They need to know if you have any of these conditions: -circulation problems, or blood vessel disease -diabetes -history of heart attack or heart disease, vasospastic angina -kidney disease -liver disease -lung or breathing disease, like asthma or emphysema -pheochromocytoma -slow heart rate -thyroid disease -an unusual or allergic reaction to propranolol, other beta-blockers, medicines, foods, dyes, or preservatives -pregnant or trying to get pregnant -breast-feeding How should I use this medicine? Take this medicine by mouth with a  glass of water. Follow the directions on the prescription label. Do not crush or chew. Take your doses at regular intervals. Do not take your medicine more often than directed. Do not stop taking except on the advice of your doctor or health care professional. Talk to your pediatrician regarding the use of this medicine in children. Special care may be needed. Overdosage: If you think you have taken too much of this medicine contact a poison control center or emergency room at once. NOTE: This medicine is only for you. Do not share this medicine with others. What if I miss a dose? If you miss a dose, take it as soon as you can. If it is almost time for your next dose, take only that dose. Do not take double or extra doses. What may interact with this medicine? Do not take this medicine with any of the following medications: -feverfew -phenothiazines like chlorpromazine, mesoridazine, prochlorperazine, thioridazine This medicine may also interact with the following medications: -aluminum hydroxide gel -antipyrine -antiviral medicines for HIV or AIDS -barbiturates like phenobarbital -certain medicines for blood pressure, heart disease, irregular heart beat -cimetidine -ciprofloxacin -diazepam -fluconazole -haloperidol -isoniazid -medicines for cholesterol like cholestyramine or colestipol -medicines for mental depression -medicines for migraine headache like almotriptan, eletriptan, frovatriptan, naratriptan, rizatriptan, sumatriptan, zolmitriptan -NSAIDs, medicines for pain and inflammation, like ibuprofen or naproxen -phenytoin -rifampin -teniposide -theophylline -thyroid medicines -tolbutamide -warfarin -zileuton This list may not describe all possible interactions. Give your health care provider a list of all the medicines, herbs, non-prescription drugs, or dietary supplements you use. Also tell them if you smoke, drink alcohol, or use  illegal drugs. Some items may interact with  your medicine. What should I watch for while using this medicine? Visit your doctor or health care professional for regular check ups. Contact your doctor right away if your symptoms worsen. Check your blood pressure and pulse rate regularly. Ask your health care professional what your blood pressure and pulse rate should be, and when you should contact them. Do not stop taking this medicine suddenly. This could lead to serious heart-related effects. You may get drowsy or dizzy. Do not drive, use machinery, or do anything that needs mental alertness until you know how this drug affects you. Do not stand or sit up quickly, especially if you are an older patient. This reduces the risk of dizzy or fainting spells. Alcohol can make you more drowsy and dizzy. Avoid alcoholic drinks. This medicine can affect blood sugar levels. If you have diabetes, check with your doctor or health care professional before you change your diet or the dose of your diabetic medicine. Do not treat yourself for coughs, colds, or pain while you are taking this medicine without asking your doctor or health care professional for advice. Some ingredients may increase your blood pressure. What side effects may I notice from receiving this medicine? Side effects that you should report to your doctor or health care professional as soon as possible: -allergic reactions like skin rash, itching or hives, swelling of the face, lips, or tongue -breathing problems -changes in blood sugar -cold hands or feet -difficulty sleeping, nightmares -dry peeling skin -hallucinations -muscle cramps or weakness -slow heart rate -swelling of the legs and ankles -vomiting Side effects that usually do not require medical attention (report to your doctor or health care professional if they continue or are bothersome): -change in sex drive or performance -diarrhea -dry sore eyes -hair loss -nausea -weak or tired This list may not describe all  possible side effects. Call your doctor for medical advice about side effects. You may report side effects to FDA at 1-800-FDA-1088. Where should I keep my medicine? Keep out of the reach of children. Store at room temperature between 15 and 30 degrees C (59 and 86 degrees F). Protect from light, moisture and freezing. Keep container tightly closed. Throw away any unused medicine after the expiration date. NOTE: This sheet is a summary. It may not cover all possible information. If you have questions about this medicine, talk to your doctor, pharmacist, or health care provider.  2017 Elsevier/Gold Standard (2012-12-16 14:58:56)

## 2016-04-09 ENCOUNTER — Telehealth: Payer: Self-pay | Admitting: *Deleted

## 2016-04-09 LAB — BASIC METABOLIC PANEL
BUN/Creatinine Ratio: 15 (ref 9–23)
BUN: 17 mg/dL (ref 6–24)
CO2: 22 mmol/L (ref 18–29)
Calcium: 10.2 mg/dL (ref 8.7–10.2)
Chloride: 104 mmol/L (ref 96–106)
Creatinine, Ser: 1.16 mg/dL — ABNORMAL HIGH (ref 0.57–1.00)
GFR calc Af Amer: 65 mL/min/{1.73_m2} (ref >59)
GFR calc non Af Amer: 57 mL/min/{1.73_m2} — ABNORMAL LOW (ref >59)
Glucose: 80 mg/dL (ref 65–99)
Potassium: 4.8 mmol/L (ref 3.5–5.2)
Sodium: 144 mmol/L (ref 134–144)

## 2016-04-09 NOTE — Telephone Encounter (Signed)
Called and spoke with pt about labs per AA,MD note. Advised she should f/u with PCP. She states she follows with Dr Kathyrn Lass at [facility]. Advised I will send copy to them for their records. She verbalized understanding.  Send copy via EPIC to PCP.

## 2016-04-09 NOTE — Telephone Encounter (Signed)
-----   Message from Melvenia Beam, MD sent at 04/09/2016 11:07 AM EST ----- Creatinine is  Slightly elevated 1.16, but not sure what her baseline is. She should follow up with primary care for evaluation of causes thanks

## 2016-04-12 DIAGNOSIS — R9082 White matter disease, unspecified: Secondary | ICD-10-CM | POA: Insufficient documentation

## 2016-04-12 DIAGNOSIS — G43009 Migraine without aura, not intractable, without status migrainosus: Secondary | ICD-10-CM | POA: Insufficient documentation

## 2016-07-09 ENCOUNTER — Other Ambulatory Visit: Payer: Self-pay | Admitting: Neurology

## 2016-07-09 ENCOUNTER — Telehealth: Payer: Self-pay | Admitting: Neurology

## 2016-07-09 DIAGNOSIS — R519 Headache, unspecified: Secondary | ICD-10-CM

## 2016-07-09 DIAGNOSIS — R51 Headache: Secondary | ICD-10-CM

## 2016-07-09 DIAGNOSIS — R0683 Snoring: Secondary | ICD-10-CM

## 2016-07-09 NOTE — Telephone Encounter (Signed)
Pt called in stating she would like to be set up for a sleep study.  Even with 8 hours of sleep she is still feeling exhausted throughout the day.

## 2016-07-09 NOTE — Telephone Encounter (Signed)
I did not specifically address this with her last time but I think that's fine, she is obese and snores tremendously and has headaches. I will send in the sleep consult and request Dr. Brett Fairy thanks

## 2016-07-10 NOTE — Telephone Encounter (Signed)
Pt called back this morning. Msg relayed that Dr Jaynee Eagles is referring her for sleep consult, she understood and will call back around Wednesday to speak with Vaughan Basta if she has not been contacted.

## 2016-07-20 ENCOUNTER — Ambulatory Visit (INDEPENDENT_AMBULATORY_CARE_PROVIDER_SITE_OTHER): Payer: PRIVATE HEALTH INSURANCE | Admitting: Neurology

## 2016-07-20 ENCOUNTER — Encounter: Payer: Self-pay | Admitting: Neurology

## 2016-07-20 VITALS — BP 124/88 | HR 72 | Ht 61.5 in | Wt 197.4 lb

## 2016-07-20 DIAGNOSIS — R930 Abnormal findings on diagnostic imaging of skull and head, not elsewhere classified: Secondary | ICD-10-CM | POA: Diagnosis not present

## 2016-07-20 DIAGNOSIS — C719 Malignant neoplasm of brain, unspecified: Secondary | ICD-10-CM

## 2016-07-20 DIAGNOSIS — R0683 Snoring: Secondary | ICD-10-CM | POA: Diagnosis not present

## 2016-07-20 DIAGNOSIS — G4719 Other hypersomnia: Secondary | ICD-10-CM

## 2016-07-20 DIAGNOSIS — R9082 White matter disease, unspecified: Secondary | ICD-10-CM

## 2016-07-20 DIAGNOSIS — G43009 Migraine without aura, not intractable, without status migrainosus: Secondary | ICD-10-CM | POA: Diagnosis not present

## 2016-07-20 MED ORDER — PROPRANOLOL HCL ER 60 MG PO CP24: 60.0000 mg | ORAL_CAPSULE | Freq: Every day | ORAL | 4 refills | Status: DC

## 2016-07-20 MED ORDER — TOPIRAMATE 100 MG PO TABS: 100.0000 mg | ORAL_TABLET | Freq: Every day | ORAL | 4 refills | Status: DC

## 2016-07-20 MED ORDER — ELETRIPTAN HYDROBROMIDE 40 MG PO TABS: 40.0000 mg | ORAL_TABLET | ORAL | 11 refills | Status: DC | PRN

## 2016-07-20 MED ORDER — ALPRAZOLAM 0.5 MG PO TABS: ORAL_TABLET | ORAL | 0 refills | Status: DC

## 2016-07-20 MED ORDER — VENLAFAXINE HCL ER 150 MG PO CP24: 150.0000 mg | ORAL_CAPSULE | Freq: Every day | ORAL | 4 refills | Status: DC

## 2016-07-20 NOTE — Progress Notes (Signed)
[facility]    Provider:  Dr Jaynee Eagles Referring Provider: No ref. provider found Primary Care Physician:  Tawanna Solo, MD   Interval history 07/20/2016: She snores a lot. She gets 8 hours of sleep and is still exhausted. The kids record her snoring. Morning headaches, wakes with headaches. The propranolol helped with the migraines. Need to repeat MRI brain due to lesion seen in 2012. She has a sleep evaluation scheduled. Headaches improved but still with morning headaches.  HPI:  Shelby Fields is a 46 y.o. female here as a referral from Dr. Dimas Aguas for headaches. She has a Hx of migraines. Sjhe is on topamax and effexor. Recently worsened. Within the last month worsening, no inciting events to worsening. Headaches are pounding in the forehead across the front. She has had daily headaches in the setting of elevated blood pressure (694H systolic) but may be due to the headache pain as opposed to hypertension. The headaches develop later in the day. She has nausea, no vomiting. She has had light sensitivity. Pounding and throbbing can be severe a 10/10 she has had to leave work and get a toradol injection and phenergan. One migraine can last all day. She tried imitrex in the past and it made her sick. Can't remember if it worked. She has gained a lot of weight. She has dizziness and lightheadedness associated with the headaches. She snores heavily. Previous to this month was having 4 headache days a month, so this month is significantly worsened. No new medications, no changes in sleeping or work habits, no known food or other triggers, ibuprofen helps but does not relieve the headache. Quiet room helps. No vision changes. No aura. No other focal neurologic deficits, associated symptoms, modifiable factors.  Reviewed notes, labs and imaging from outside physicians, which showed: Reviewed notes from Dr. love who is a neurologist she saw in 2012. At that time she was seen for headaches.  In 2007 and MRI of the brain showed a single white matter lesion in the right parietal region which did not enhance, neurologic exam was normal but lesion was concerning for remote embolic stroke or possibly a demyelinating disorder. RPR, sedimentation rate and carotid study of the Dopplers were negative in 2012. At the time she was complaining of numbness in her left hand and in her left foot. Her right side was normal. She was having headaches in the posterior head region and neck pain 5 days per week. She describes the pain as 7 out of 10 in severity. She noted nausea without vomiting with her headaches. No visual changes with the headaches. She denied Lhermitte sign, bowel or bladder dysfunction, or visual disturbances. Repeat MRI of the brain continued to show the abnormality measuring 6.6 mm in the right posterior frontal lobe, bilateral maxillary sinuses, and hypoplastic posterior circulation disease normal variant. In addition to the headache pain she had dizzy spells lasting up to 5 minutes occurring as often as 2 times per week mostly in the sitting position described as spinning without nausea and vomiting.  Personally reviewed MRI of the brain imaging, there is a 6x6 millimeter right posterior frontal, subcortical focus of T2 FLAIR hyperintensity. It did not enhance. Considerations include autoimmune, inflammatory, postinfectious, chronic ischemic, migraine associated or idiopathic etiology.  Review of Systems: Patient complains of symptoms per HPI as well as the following symptoms:no cp, no sob. Endorses headache, numbness, shortness of breath. Pertinent negatives per HPI. All others negative.   Social History   Social History  .  Marital status: Married    Spouse name: Berneta Sages  . Number of children: 2  . Years of education: 14   Occupational History  . Nurse- LPN    Social History Main Topics  . Smoking status: Never Smoker  . Smokeless tobacco: Never Used  . Alcohol use Yes      Comment: Socially  . Drug use: No  . Sexual activity: Not on file   Other Topics Concern  . Not on file   Social History Narrative   Lives with husband and 2 kids   Caffeine use: Tea/soda- occas   Right-handed       Family History  Problem Relation Age of Onset  . Anemia Mother   . Bladder Cancer Father   . Asthma Brother   . Ovarian cancer Paternal Grandmother     Past Medical History:  Diagnosis Date  . Anxiety   . Migraine   . Obesity     Past Surgical History:  Procedure Laterality Date  . VAGINAL HYSTERECTOMY  2005   Still has ovaries    Current Outpatient Prescriptions  Medication Sig Dispense Refill  . Cholecalciferol (VITAMIN D PO) Take 50,000 Units by mouth once a week.    Marland Kitchen ibuprofen (ADVIL,MOTRIN) 800 MG tablet Take 800 mg by mouth 3 (three) times daily as needed.    . Multiple Vitamins-Minerals (MULTIVITAMIN PO) Take 1 tablet by mouth daily.    . ondansetron (ZOFRAN) 4 MG tablet Take 4 mg by mouth every 8 (eight) hours as needed for nausea or vomiting.    . propranolol ER (INDERAL LA) 60 MG 24 hr capsule Take 1 capsule (60 mg total) by mouth at bedtime. 30 capsule 6  . topiramate (TOPAMAX) 100 MG tablet Take 100 mg by mouth daily.    Marland Kitchen venlafaxine XR (EFFEXOR-XR) 150 MG 24 hr capsule Take 150 mg by mouth daily with breakfast.     No current facility-administered medications for this visit.     Allergies as of 07/20/2016  . (No Known Allergies)    Vitals: BP 124/88   Pulse 72   Ht 5' 1.5" (1.562 m)   Wt 197 lb 6.4 oz (89.5 kg)   BMI 36.69 kg/m  Last Weight:  Wt Readings from Last 1 Encounters:  07/20/16 197 lb 6.4 oz (89.5 kg)   Last Height:   Ht Readings from Last 1 Encounters:  07/20/16 5' 1.5" (1.562 m)   Physical exam: Exam: Gen: NAD, conversant, well nourised, obese, well groomed                     CV: RRR, no MRG. No Carotid Bruits. No peripheral edema, warm, nontender Eyes: Conjunctivae clear without exudates or  hemorrhage  Neuro: Detailed Neurologic Exam  Speech:    Speech is normal; fluent and spontaneous with normal comprehension.  Cognition:    The patient is oriented to person, place, and time;     recent and remote memory intact;     language fluent;     normal attention, concentration,     fund of knowledge Cranial Nerves:    The pupils are equal, round, and reactive to light. The fundi are normal and spontaneous venous pulsations are present. Visual fields are full to finger confrontation. Extraocular movements are intact. Trigeminal sensation is intact and the muscles of mastication are normal. The face is symmetric. The palate elevates in the midline. Hearing intact. Voice is normal. Shoulder shrug is normal. The tongue has normal  motion without fasciculations.   Coordination:    Normal finger to nose and heel to shin. Normal rapid alternating movements.   Gait:    Heel-toe and tandem gait are normal.   Motor Observation:    No asymmetry, no atrophy, and no involuntary movements noted. Tone:    Normal muscle tone.    Posture:    Posture is normal. normal erect    Strength:    Strength is V/V in the upper and lower limbs.      Sensation: intact to LT     Reflex Exam:  DTR's:    Deep tendon reflexes in the upper and lower extremities are normal bilaterally.   Toes:    The toes are downgoing bilaterally.   Clonus:    Clonus is absent.   Assessment/Plan:  46 year old female with chronic migraine disorder, snoring, morning headaches, daytime fatigue. 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 Patient has not had reevaluation since 2012. Feel we should reevaluate MRI of the brain to follow this lesion and ensure that there has been no worsening or enlargement.  - Continue propranolol for migraines. Continue Topamax and Effexor. Zofran for nausea. BMP  today.  - repeat MRI of the brain, see above for differential: parietal lesion that was concerning for right parietal slow growing benign tumor - For snoring, morning headaches: sleep eval - Discussed: To prevent or relieve headaches, try the following:  Cool Compress. Lie down and place a cool compress on your head.   Avoid headache triggers. If certain foods or odors seem to have triggered your migraines in the past, avoid them. A headache diary might help you identify triggers.   Include physical activity in your daily routine. Try a daily walk or other moderate aerobic exercise.   Manage stress. Find healthy ways to cope with the stressors, such as delegating tasks on your to-do list.   Practice relaxation techniques. Try deep breathing, yoga, massage and visualization.   Eat regularly. Eating regularly scheduled meals and maintaining a healthy diet might help prevent headaches. Also, drink plenty of fluids.   Follow a regular sleep schedule. Sleep deprivation might contribute to headaches  Consider biofeedback. With this mind-body technique, you learn to control certain bodily functions - such as muscle tension, heart rate and blood pressure - to prevent headaches or reduce headache pain.    Proceed to emergency room if you experience new or worsening symptoms or symptoms do not resolve, if you have new neurologic symptoms or if headache is severe, or for any concerning symptom.   Sarina Ill, MD  [facility] [address] [place], [place] [address]  Phone [phone] Fax [phone]  A total of 30 minutes was spent face-to-face with this patient. Over half this time was spent on counseling patient on the migraines, morning headache, snoring, daytime fatigue diagnosis and different diagnostic and therapeutic options available.

## 2016-07-20 NOTE — Patient Instructions (Addendum)
Remember to drink plenty of fluid, eat healthy meals and do not skip any meals. Try to eat protein with a every meal and eat a healthy snack such as fruit or nuts in between meals. Try to keep a regular sleep-wake schedule and try to exercise daily, particularly in the form of walking, 20-30 minutes a day, if you can.   As far as your medications are concerned, I would like to suggest: Continue Propranolol  As far as diagnostic testing: MRI brain w/wo contrast  I would like to see you back as needed, sooner if we need to. Please call us with any interim questions, concerns, problems, updates or refill requests.   Our phone number is [phone]. We also have an after hours call service for urgent matters and there is a physician on-call for urgent questions. For any emergencies you know to call 911 or go to the nearest emergency room   Eletriptan tablets What is this medicine? ELETRIPTAN (el ih TRIP tan) is used to treat migraines with or without aura. An aura is a strange feeling or visual disturbance that warns you of an attack. It is not used to prevent migraines. This medicine may be used for other purposes; ask your health care provider or pharmacist if you have questions. COMMON BRAND NAME(S): Relpax What should I tell my health care provider before I take this medicine? They need to know if you have any of these conditions: -bowel disease or colitis -diabetes -family history of heart disease -fast or irregular heart beat -heart or blood vessel disease, angina (chest pain), or previous heart attack -high blood pressure -high cholesterol -history of stroke, transient ischemic attacks (TIAs or mini-strokes), or intracranial bleeding -kidney or liver disease -overweight -poor circulation -postmenopausal or surgical removal of uterus and ovaries -Raynaud's disease -seizure disorder -an unusual or allergic reaction to eletriptan, other medicines, foods, dyes, or  preservatives -pregnant or trying to get pregnant -breast-feeding How should I use this medicine? Take this medicine by mouth with a glass of water. Follow the directions on the prescription label. This medicine is taken at the first symptoms of a migraine. It is not for everyday use. If your migraine headache returns after one dose, you can take another dose as directed. You must leave at least 2 hours between doses, and do not take more than 40 mg as a single dose. Do not take more than 80 mg total in any 24 hour period. If there is no improvement at all after the first dose, do not take a second dose without talking to your doctor or health care professional. Do not take your medicine more often than directed. Talk to your pediatrician regarding the use of this medicine in children. Special care may be needed. Overdosage: If you think you have taken too much of this medicine contact a poison control center or emergency room at once. NOTE: This medicine is only for you. Do not share this medicine with others. What if I miss a dose? This does not apply; this medicine is not for regular use. What may interact with this medicine? Do not take this medicine with any of the following medications: -antiviral medicines for HIV or AIDS -certain antibiotics like clarithromycin, erythromycin, telithromycin -cimetidine -conivaptan -dalfopristin; quinupristin -diltiazem -ergot alkaloids like dihydroergotamine, ergonovine, ergotamine, methylergonovine -fluvoxamine -idelalisib -imatinib -medicines for fungal infections like fluconazole, itraconazole, ketoconazole, and voriconazole -mifepristone -nefazodone -stimulant medicines for attention disorders, weight loss, or to stay awake -verapamil -zafirlukast This medicine may also interact  with the following medications: -certain medicines for depression, anxiety, or psychotic disturbances This list may not describe all possible interactions. Give your  health care provider a list of all the medicines, herbs, non-prescription drugs, or dietary supplements you use. Also tell them if you smoke, drink alcohol, or use illegal drugs. Some items may interact with your medicine. What should I watch for while using this medicine? Only take this medicine for a migraine headache. Take it if you get warning symptoms or at the start of a migraine attack. It is not for regular use to prevent migraine attacks. You may get drowsy or dizzy. Do not drive, use machinery, or do anything that needs mental alertness until you know how this medicine affects you. To reduce dizzy or fainting spells, do not sit or stand up quickly, especially if you are an older patient. Alcohol can increase drowsiness, dizziness and flushing. Avoid alcoholic drinks. Smoking cigarettes may increase the risk of heart-related side effects from using this medicine. If you take migraine medicines for 10 or more days a month, your migraines may get worse. Keep a diary of headache days and medicine use. Contact your healthcare professional if your migraine attacks occur more frequently. What side effects may I notice from receiving this medicine? Side effects that you should report to your doctor or health care professional as soon as possible: -allergic reactions like skin rash, itching or hives, swelling of the face, lips, or tongue -fast, slow, or irregular heart beat -increased or decreased blood pressure -seizures -severe stomach pain and cramping, bloody diarrhea -signs and symptoms of a blood clot such as breathing problems; changes in vision; chest pain; severe, sudden headache; pain, swelling, warmth in the leg; trouble speaking; sudden numbness or weakness of the face, arm or leg -tingling, pain, or numbness in the face, hands, or feet Side effects that usually do not require medical attention (report to your doctor or health care professional if they continue or are  bothersome): -drowsiness -feeling warm, flushing, or redness of the face -headache -muscle cramps, pain -nausea, vomiting -unusually weak or tired This list may not describe all possible side effects. Call your doctor for medical advice about side effects. You may report side effects to FDA at 1-800-FDA-1088. Where should I keep my medicine? Keep out of the reach of children. Store at room temperature between 15 and 30 degrees C (59 and 86 degrees F). Throw away any unused medicine after the expiration date. NOTE: This sheet is a summary. It may not cover all possible information. If you have questions about this medicine, talk to your doctor, pharmacist, or health care provider.  2018 Elsevier/Gold Standard (2015-04-11 11:20:42)

## 2016-07-21 ENCOUNTER — Telehealth: Payer: Self-pay

## 2016-07-21 LAB — BASIC METABOLIC PANEL
BUN/Creatinine Ratio: 14 (ref 9–23)
BUN: 16 mg/dL (ref 6–24)
CO2: 23 mmol/L (ref 18–29)
Calcium: 9.5 mg/dL (ref 8.7–10.2)
Chloride: 106 mmol/L (ref 96–106)
Creatinine, Ser: 1.18 mg/dL — ABNORMAL HIGH (ref 0.57–1.00)
GFR calc Af Amer: 64 mL/min/{1.73_m2} (ref >59)
GFR calc non Af Amer: 55 mL/min/{1.73_m2} — ABNORMAL LOW (ref >59)
Glucose: 91 mg/dL (ref 65–99)
Potassium: 5 mmol/L (ref 3.5–5.2)
Sodium: 145 mmol/L — ABNORMAL HIGH (ref 134–144)

## 2016-07-21 NOTE — Telephone Encounter (Signed)
Called pt w/ unremarkable lab results except slightly elevated creatinine. May call back w/ additional questions/concerns.

## 2016-07-21 NOTE — Telephone Encounter (Signed)
-----   Message from Melvenia Beam, MD sent at 07/21/2016 12:26 PM EDT ----- Labs unremarkable, creatinine slightly elevated at 1.18 but this is stable from 3 months ago thanks

## 2016-08-02 ENCOUNTER — Ambulatory Visit
Admission: RE | Admit: 2016-08-02 | Discharge: 2016-08-02 | Disposition: A | Discharge status: Home / Self Care | Payer: PRIVATE HEALTH INSURANCE | Source: Ambulatory Visit | Attending: Neurology | Admitting: Neurology

## 2016-08-02 DIAGNOSIS — C719 Malignant neoplasm of brain, unspecified: Secondary | ICD-10-CM

## 2016-08-02 DIAGNOSIS — R9082 White matter disease, unspecified: Secondary | ICD-10-CM

## 2016-08-02 DIAGNOSIS — R930 Abnormal findings on diagnostic imaging of skull and head, not elsewhere classified: Secondary | ICD-10-CM | POA: Diagnosis not present

## 2016-08-02 MED ORDER — GADOBENATE DIMEGLUMINE 529 MG/ML IV SOLN
15.0000 mL | Freq: Once | INTRAVENOUS | Status: AC | PRN
  Administered 2016-08-02: 15 mL via INTRAVENOUS

## 2016-08-03 ENCOUNTER — Encounter: Payer: Self-pay | Admitting: Neurology

## 2016-08-03 ENCOUNTER — Ambulatory Visit (INDEPENDENT_AMBULATORY_CARE_PROVIDER_SITE_OTHER): Payer: PRIVATE HEALTH INSURANCE | Admitting: Neurology

## 2016-08-03 ENCOUNTER — Telehealth: Payer: Self-pay | Admitting: Neurology

## 2016-08-03 VITALS — BP 121/81 | HR 62 | Resp 20 | Ht 61.5 in | Wt 196.0 lb

## 2016-08-03 DIAGNOSIS — R51 Headache: Secondary | ICD-10-CM | POA: Diagnosis not present

## 2016-08-03 DIAGNOSIS — R519 Headache, unspecified: Secondary | ICD-10-CM

## 2016-08-03 DIAGNOSIS — R400 Somnolence: Secondary | ICD-10-CM | POA: Diagnosis not present

## 2016-08-03 DIAGNOSIS — R0683 Snoring: Secondary | ICD-10-CM | POA: Diagnosis not present

## 2016-08-03 DIAGNOSIS — E669 Obesity, unspecified: Secondary | ICD-10-CM | POA: Diagnosis not present

## 2016-08-03 DIAGNOSIS — R351 Nocturia: Secondary | ICD-10-CM | POA: Diagnosis not present

## 2016-08-03 NOTE — Telephone Encounter (Signed)
Shelby Fields had her MRI yesterday and she was wanting to know her results as soon as possible. She was here today to see Dr. Rexene Alberts and then was asking about her MRI.

## 2016-08-03 NOTE — Progress Notes (Signed)
Subjective:    Patient ID: Shelby Fields is a 46 y.o. female.  HPI     Star Age, MD, PhD [facility] [address], [address] P.O. [place], [place] [address]  Dear Shelby Fields,   I saw your patient, Shelby Fields, upon your kind request in my clinic today for initial consultation of her sleep disorder, in particular, concern for underlying obstructive sleep apnea. The patient is unaccompanied today. As you know, [name] is a 46 year old right-handed woman with an underlying medical history of migraine headaches, anxiety, vitamin D deficiency, and obesity, who reports snoring and excessive daytime somnolence. I reviewed your office note from 07/20/2016. Her Epworth sleepiness score is 13 out of 24 today, her fatigue score is 49 out of 63. She is married and lives with her husband and both children. She works at a long-term care facility. She is a nonsmoker and denies illicit drug use, does not utilize caffeine on a daily basis. Bedtime is between 9:30 and 10 PM and wakeup time usually around 6 AM. She does watch TV in bed turns TV off when she is trying to fall asleep. She wakes up with a headache on average once or twice per week. She reports nocturia once or twice per average night. She has no family history of obstructive sleep apnea as she recalls, her sister may have had a sleep study. She reports weight gain in the realm of 20 pounds in the past 2-3 years. She does not typically wake up rested. Sometimes she takes a nap when possible but does not necessarily wake up better rested after a nap. She denies restless legs type symptoms or leg twitching at night. Her husband works nights and therefore they don't typically overlap very much with their sleep schedules.  Her Past Medical History Is Significant For: Past Medical History:  Diagnosis Date  . Anxiety   . Migraine   . Obesity     Her Past Surgical History Is Significant For: Past Surgical History:   Procedure Laterality Date  . VAGINAL HYSTERECTOMY  2005   Still has ovaries    Her Family History Is Significant For: Family History  Problem Relation Age of Onset  . Anemia Mother   . Bladder Cancer Father   . Asthma Brother   . Ovarian cancer Paternal Grandmother     Her Social History Is Significant For: Social History   Social History  . Marital status: Married    Spouse name: Berneta Sages  . Number of children: 2  . Years of education: 14   Occupational History  . Nurse- LPN    Social History Main Topics  . Smoking status: Never Smoker  . Smokeless tobacco: Never Used  . Alcohol use Yes     Comment: Socially  . Drug use: No  . Sexual activity: Not Asked   Other Topics Concern  . None   Social History Narrative   Lives with husband and 2 kids   Caffeine use: Tea/soda- occas   Right-handed       Her Allergies Are:  No Known Allergies:   Her Current Medications Are:  Outpatient Encounter Prescriptions as of 08/03/2016  Medication Sig  . Cholecalciferol (VITAMIN D PO) Take 50,000 Units by mouth once a week.  . eletriptan (RELPAX) 40 MG tablet Take 1 tablet (40 mg total) by mouth as needed for migraine or headache. May repeat in 2 hours if headache persists or recurs.  Marland Kitchen ibuprofen (ADVIL,MOTRIN) 800 MG tablet  Take 800 mg by mouth 3 (three) times daily as needed.  . Multiple Vitamins-Minerals (MULTIVITAMIN PO) Take 1 tablet by mouth daily.  . ondansetron (ZOFRAN) 4 MG tablet Take 4 mg by mouth every 8 (eight) hours as needed for nausea or vomiting.  . propranolol ER (INDERAL LA) 60 MG 24 hr capsule Take 1 capsule (60 mg total) by mouth at bedtime.  . topiramate (TOPAMAX) 100 MG tablet Take 1 tablet (100 mg total) by mouth daily.  Marland Kitchen venlafaxine XR (EFFEXOR-XR) 150 MG 24 hr capsule Take 1 capsule (150 mg total) by mouth daily with breakfast.  . [DISCONTINUED] ALPRAZolam (XANAX) 0.5 MG tablet Take 1-2 tabs 30-60 minutes before MRI. May take one additional if needed.    No facility-administered encounter medications on file as of 08/03/2016.   :  Review of Systems:  Out of a complete 14 point review of systems, all are reviewed and negative with the exception of these symptoms as listed below: Review of Systems  Neurological:       Pt presents today to discuss her sleep. Pt has never had a sleep study but does endorse snoring.  Epworth Sleepiness Scale 0= would never doze 1= slight chance of dozing 2= moderate chance of dozing 3= high chance of dozing  Sitting and reading: 2 Watching TV: 3 Sitting inactive in a public place (ex. Theater or meeting): 2 As a passenger in a car for an hour without a break: 3 Lying down to rest in the afternoon: 1 Sitting and talking to someone: 1 Sitting quietly after lunch (no alcohol): 1 In a car, while stopped in traffic: 0 Total: 13     Objective:  Neurologic Exam  Physical Exam Physical Examination:   Vitals:   08/03/16 1055  BP: 121/81  Pulse: 62  Resp: 20    General Examination: The patient is a very pleasant 46 y.o. female in no acute distress. She appears well-developed and well-nourished and well groomed.   HEENT: Normocephalic, atraumatic, pupils are equal, round and reactive to light and accommodation. Funduscopic exam is normal with sharp disc margins noted. Extraocular tracking is good without limitation to gaze excursion or nystagmus noted. Normal smooth pursuit is noted. Hearing is grossly intact. Tympanic membranes are clear bilaterally. Face is symmetric with normal facial animation and normal facial sensation. Speech is clear with no dysarthria noted. There is no hypophonia. There is no lip, neck/head, jaw or voice tremor. Neck is supple with full range of passive and active motion. There are no carotid bruits on auscultation. Oropharynx exam reveals: mild mouth dryness, adequate dental hygiene and moderate airway crowding, due to smaller airway opening, thicker tongue and tonsils of 2-3+  bilaterally. Mallampati is class II, neck circumference is 14 and 78 inches. She has a Mild overbite. Nasal inspection reveals no significant nasal mucosal bogginess or redness and no septal deviation.   Chest: Clear to auscultation without wheezing, rhonchi or crackles noted.  Heart: S1+S2+0, regular and normal without murmurs, rubs or gallops noted.   Abdomen: Soft, non-tender and non-distended with normal bowel sounds appreciated on auscultation.  Extremities: There is no pitting edema in the distal lower extremities bilaterally. Pedal pulses are intact.  Skin: Warm and dry without trophic changes noted.  Musculoskeletal: exam reveals no obvious joint deformities, tenderness or joint swelling or erythema.   Neurologically:  Mental status: The patient is awake, alert and oriented in all 4 spheres. Her immediate and remote memory, attention, language skills and fund of knowledge are  appropriate. There is no evidence of aphasia, agnosia, apraxia or anomia. Speech is clear with normal prosody and enunciation. Thought process is linear. Mood is normal and affect is normal.  Cranial nerves II - XII are as described above under HEENT exam. In addition: shoulder shrug is normal with equal shoulder height noted. Motor exam: Normal bulk, strength and tone is noted. There is no drift, tremor or rebound. Romberg is negative. Reflexes are 1-2+ throughout. Babinski: Toes are flexor bilaterally. Fine motor skills and coordination: intact with normal finger taps, normal hand movements, normal rapid alternating patting, normal foot taps and normal foot agility.  Cerebellar testing: No dysmetria or intention tremor on finger to nose testing. Heel to shin is unremarkable bilaterally. There is no truncal or gait ataxia.  Sensory exam: intact to light touch, vibration, temperature sense in the upper and lower extremities.  Gait, station and balance: She stands easily. No veering to one side is noted. No leaning  to one side is noted. Posture is age-appropriate and stance is narrow based. Gait shows normal stride length and normal pace. No problems turning are noted. Tandem walk is unremarkable.   Assessment and Plan:  In summary, MIKEYA TOMASETTI is a very pleasant 46 y.o.-year old female with an underlying medical history of migraine headaches, anxiety, vitamin D deficiency, and obesity, whose history and physical exam concerning for obstructive sleep apnea (OSA). I had a long chat with the patient about my findings and the diagnosis of OSA, its prognosis and treatment options. We talked about medical treatments, surgical interventions and non-pharmacological approaches. I explained in particular the risks and ramifications of untreated moderate to severe OSA, especially with respect to developing cardiovascular disease down the Road, including congestive heart failure, difficult to treat hypertension, cardiac arrhythmias, or stroke. Even type 2 diabetes has, in part, been linked to untreated OSA. Symptoms of untreated OSA include daytime sleepiness, memory problems, mood irritability and mood disorder such as depression and anxiety, lack of energy, as well as recurrent headaches, especially morning headaches. We talked about trying to maintain a healthy lifestyle in general, as well as the importance of weight control. I encouraged the patient to eat healthy, exercise daily and keep well hydrated, to keep a scheduled bedtime and wake time routine, to not skip any meals and eat healthy snacks in between meals. I advised the patient not to drive when feeling sleepy. I recommended the following at this time: sleep study with potential positive airway pressure titration. (We will score hypopneas at 3%).   I explained the sleep test procedure to the patient and also outlined possible surgical and non-surgical treatment options of OSA, including the use of a custom-made dental device (which would require a referral to a  specialist dentist or oral surgeon), upper airway surgical options, such as pillar implants, radiofrequency surgery, tongue base surgery, and UPPP (which would involve a referral to an ENT surgeon). Rarely, jaw surgery such as mandibular advancement may be considered.  I also explained the CPAP treatment option to the patient, who indicated that she would be willing to try CPAP if the need arises. I explained the importance of being compliant with PAP treatment, not only for insurance purposes but primarily to improve Her symptoms, and for the patient's long term health benefit, including to reduce Her cardiovascular risks. I answered all her questions today and the patient was in agreement. I would like to see her back after the sleep study is completed and encouraged her to call  with any interim questions, concerns, problems or updates.   Thank you very much for allowing me to participate in the care of this nice patient. If I can be of any further assistance to you please do not hesitate to talk to me.  Sincerely,   Star Age, MD, PhD

## 2016-08-03 NOTE — Patient Instructions (Signed)

## 2016-08-04 ENCOUNTER — Telehealth: Payer: Self-pay | Admitting: Neurology

## 2016-08-04 NOTE — Telephone Encounter (Signed)
Pt would like to be contacted as soon as the MRI results are available

## 2016-08-04 NOTE — Telephone Encounter (Signed)
Let patient know the MRI is unremarkable and essentially the same as the MRI in 2012. Nothing acute. I can show her the images at next appointment thanks.

## 2016-08-04 NOTE — Telephone Encounter (Signed)
See phone/result note.

## 2016-08-04 NOTE — Telephone Encounter (Signed)
Pt had MRI performed 08/02/16 w/ the following IMPRESSION:  This MRI of the brain with and without contrast shows the following: 1.    6-7 millimeter T2/FLAIR hyperintense focus in the deep white matter of the posterior right frontal lobe and two punctate focus in the left parietal lobe and right frontal lobe. These are nonspecific but most likely represent foci of chronic microvascular ischemic gliosis.   The larger focus and the left parietal focus were present on the prior MRI. 2.    Incidental note is made of variant anatomy with diminutive vertebral and basilar arteries and fetal origins of both posterior cerebral arteries 3.    There are no acute findings and there is a normal enhancement pattern.

## 2016-08-05 NOTE — Telephone Encounter (Signed)
Called pt w/ unremarkable MRI results. Verbalized understanding and appreciation for call.

## 2016-08-19 ENCOUNTER — Ambulatory Visit (INDEPENDENT_AMBULATORY_CARE_PROVIDER_SITE_OTHER): Payer: PRIVATE HEALTH INSURANCE | Admitting: Neurology

## 2016-08-19 DIAGNOSIS — G4730 Sleep apnea, unspecified: Secondary | ICD-10-CM | POA: Diagnosis not present

## 2016-08-19 DIAGNOSIS — G4720 Circadian rhythm sleep disorder, unspecified type: Secondary | ICD-10-CM

## 2016-08-19 DIAGNOSIS — G4733 Obstructive sleep apnea (adult) (pediatric): Secondary | ICD-10-CM

## 2016-08-21 NOTE — Procedures (Signed)
PATIENT'S NAME:  Shelby Fields, Shelby Fields DOB:      01-11-1971      MR#:    [id number]     DATE OF RECORDING: 08/19/2016 REFERRING M.D.: Sarina Ill, DO, PCP: Kathyrn Lass MD Study Performed:   Baseline Polysomnogram HISTORY: 46 year old woman with a history of migraine headaches, anxiety, vitamin D deficiency, and obesity, who reports snoring and excessive daytime somnolence.  She reports weight gain in the realm of 20 pounds in the past 2-3 years. She does not typically wake up rested. The patient endorsed the Epworth Sleepiness Scale at 13 points. The patient's weight 196 pounds with a height of 61.5 (inches), resulting in a BMI of 37. kg/m2.  The patient's neck circumference measured 14.8 inches.  CURRENT MEDICATIONS: Relpax, Motrin, Zofran, Inderal LA, Topamax, Effexor XR   PROCEDURE:  This is a multichannel digital polysomnogram utilizing the Somnostar 11.2 system.  Electrodes and sensors were applied and monitored per AASM Specifications.   EEG, EOG, Chin and Limb EMG, were sampled at 200 Hz.  ECG, Snore and Nasal Pressure, Thermal Airflow, Respiratory Effort, CPAP Flow and Pressure, Oximetry was sampled at 50 Hz. Digital video and audio were recorded.      BASELINE STUDY  Lights Out was at 21:20 and Lights On at 05:08.  Total recording time (TRT) was 468.5 minutes, with a total sleep time (TST) of  426 minutes.   The patient's sleep latency was 41.5 minutes, which is delayed.  REM latency was 412 minutes, which is markedly delayed.  The sleep efficiency was 90.9 %.     SLEEP ARCHITECTURE: WASO (Wake after sleep onset) was 7.5 minutes.  There were 4.5 minutes in Stage N1, 347 minutes Stage N2, 54 minutes Stage N3 and 20.5 minutes in Stage REM.  The percentage of Stage N1 was 1.1%, Stage N2 was 81.5%, which is markedly increase, Stage N3 was 12.7%, which is mildly reduced, and Stage R (REM sleep) was 4.8%, which is markedly reduced. The arousals were noted as: 110 were spontaneous, 0 were associated with  PLMs, 1 were associated with respiratory events.    Audio and video analysis did not show any abnormal or unusual movements, behaviors, phonations or vocalizations. The patient took no bathroom breaks. Mild snoring was noted. The EKG was in keeping with normal sinus rhythm (NSR).  RESPIRATORY ANALYSIS:  There were a total of 4 respiratory events:  4 obstructive apneas, 0 central apneas and 0 mixed apneas with a total of 4 apneas and an apnea index (AI) of .6 /hour. There were 0 hypopneas with a hypopnea index of 0 /hour. The patient also had 0 respiratory event related arousals (RERAs).      The total APNEA/HYPOPNEA INDEX (AHI) was .6/hour and the total RESPIRATORY DISTURBANCE INDEX was .6 /hour.  3 events occurred in REM sleep and 0 events in NREM. The REM AHI was 8.8 /hour, versus a non-REM AHI of .1. The patient spent 0 minutes of total sleep time in the supine position and 426 minutes in non-supine, slept exclusively on the right side.  OXYGEN SATURATION & C02:  The Wake baseline 02 saturation was 97%, with the lowest being 89%. Time spent below 89% saturation equaled 0 minutes.  PERIODIC LIMB MOVEMENTS: The patient had a total of 0 Periodic Limb Movements.  The Periodic Limb Movement (PLM) index was 0 and the PLM Arousal index was 0/hour.  Post-study, the patient indicated that sleep was the same as usual.   IMPRESSION:  1. Obstructive Sleep  Apnea (OSA), Mild, REM related 2. Dysfunctions associated with sleep stages or arousal from sleep  RECOMMENDATIONS:  1. This study does not demonstrate any significant obstructive or central sleep disordered breathing with the exception of mild REM related OSA, for which CPAP therapy is not warranted. Of note, the absence of supine sleep and reduced percentage of REM sleep may underestimate the AHI and O2 nadir. Weight loss may help snoring and the REM related OSA.  2. This study shows sleep fragmentation and abnormal sleep stage percentages; these are  nonspecific findings and per se do not signify an intrinsic sleep disorder or a cause for the patient's sleep-related symptoms. Causes include (but are not limited to) the first night effect of the sleep study, circadian rhythm disturbances, medication effect or an underlying mood disorder or medical problem.  3. The patient should be cautioned not to drive, work at heights, or operate dangerous or heavy equipment when tired or sleepy. Review and reiteration of good sleep hygiene measures should be pursued with any patient. 4. The patient can follow-up with her referring provider, who will be notified of the test results.  I certify that I have reviewed the entire raw data recording prior to the issuance of this report in accordance with the Standards of Accreditation of the American Academy of Sleep Medicine (AASM)    Star Age, MD, PhD Diplomat, American Board of Psychiatry and Neurology (Neurology and Sleep Medicine)

## 2016-08-21 NOTE — Progress Notes (Signed)
Patient referred by Dr. Jaynee Eagles, seen by me on 08/03/16, diagnostic PSG on 08/19/16.   Please call and notify the patient that the recent sleep study did not show any significant obstructive sleep apnea with the exception of mild REM related OSA, for which CPAP therapy is not warranted. Of note, the absence of supine sleep and reduced percentage of REM sleep may underestimate the AHI and O2 nadir. Weight loss may help snoring and the REM related OSA. Please inform patient that she can FU with Dr. Jaynee Eagles.  Please remind patient to try to maintain good sleep hygiene, which means: Keep a regular sleep and wake schedule, try not to exercise or have a meal within 2 hours of your bedtime, try to keep your bedroom conducive for sleep, that is, cool and dark, without light distractors such as an illuminated alarm clock, and refrain from watching TV right before sleep or in the middle of the night and do not keep the TV or radio on during the night. Also, try not to use or play on electronic devices at bedtime, such as your cell phone, tablet PC or laptop. If you like to read at bedtime on an electronic device, try to dim the background light as much as possible. Do not eat in the middle of the night.  Also, route or fax report to PCP and referring MD, if other than PCP.  Once you have spoken to patient, you can close this encounter.   Thanks,  Star Age, MD, PhD [facility])

## 2016-08-25 ENCOUNTER — Telehealth: Payer: Self-pay

## 2016-08-25 NOTE — Telephone Encounter (Signed)
-----   Message from Star Age, MD sent at 08/21/2016  1:42 PM EDT ----- Patient referred by Dr. Jaynee Eagles, seen by me on 08/03/16, diagnostic PSG on 08/19/16.   Please call and notify the patient that the recent sleep study did not show any significant obstructive sleep apnea with the exception of mild REM related OSA, for which CPAP therapy is not warranted. Of note, the absence of supine sleep and reduced percentage of REM sleep may underestimate the AHI and O2 nadir. Weight loss may help snoring and the REM related OSA. Please inform patient that she can FU with Dr. Jaynee Eagles.  Please remind patient to try to maintain good sleep hygiene, which means: Keep a regular sleep and wake schedule, try not to exercise or have a meal within 2 hours of your bedtime, try to keep your bedroom conducive for sleep, that is, cool and dark, without light distractors such as an illuminated alarm clock, and refrain from watching TV right before sleep or in the middle of the night and do not keep the TV or radio on during the night. Also, try not to use or play on electronic devices at bedtime, such as your cell phone, tablet PC or laptop. If you like to read at bedtime on an electronic device, try to dim the background light as much as possible. Do not eat in the middle of the night.  Also, route or fax report to PCP and referring MD, if other than PCP.  Once you have spoken to patient, you can close this encounter.   Thanks,  Star Age, MD, PhD [facility])

## 2016-08-25 NOTE — Telephone Encounter (Signed)
I spoke to patient and she is aware of results and recommendations.  

## 2017-09-04 ENCOUNTER — Other Ambulatory Visit: Payer: Self-pay | Admitting: Neurology

## 2017-09-04 DIAGNOSIS — G43009 Migraine without aura, not intractable, without status migrainosus: Secondary | ICD-10-CM

## 2017-10-14 ENCOUNTER — Other Ambulatory Visit: Payer: Self-pay | Admitting: Neurology

## 2017-10-14 DIAGNOSIS — G43009 Migraine without aura, not intractable, without status migrainosus: Secondary | ICD-10-CM

## 2017-10-14 NOTE — Telephone Encounter (Signed)
I called pt. She has not been seen in our office in over one year. She is agreeable to an appt on 10/18/17 at 9:45am, check in 9:15am with Hoyle Sauer, NP for refills. Pt verbalized understanding of appt date and time.

## 2017-10-17 NOTE — Progress Notes (Deleted)
[facility]  PATIENT: Shelby Fields DOB: 1971-02-17   REASON FOR VISIT: *** HISTORY FROM:    HISTORY OF PRESENT ILLNESS: Interval history 07/20/2016: She snores a lot. She gets 8 hours of sleep and is still exhausted. The kids record her snoring. Morning headaches, wakes with headaches. The propranolol helped with the migraines. Need to repeat MRI brain due to lesion seen in 2012. She has a sleep evaluation scheduled. Headaches improved but still with morning headaches.  Shelby Shelby Gilesis a 47 y.o.femalehere as a referral from Dr. Myrtie Hawk headaches. She has a Hx of migraines. Sjhe is on topamax and effexor. Recently worsened. Within the last month worsening, no inciting events to worsening. Headaches are pounding in the forehead across the front. She has had daily headaches in the setting of elevated blood pressure (540J systolic) but may be due to the headache painas opposed to hypertension. The headaches develop later in the day. She has nausea, no vomiting. She has had light sensitivity. Pounding and throbbing can be severe a 10/10 she has had to leave work and get a toradol injection and phenergan. One migraine can last all day. She tried imitrex in the past and it made her sick. Can'Shelby remember if it worked. She has gained a lot of weight. She has dizziness and lightheadedness associated with the headaches. She snores heavily. Previous to this month was having 4 headache days a month, so this month is significantly worsened. No new medications, no changes in sleeping or work habits, no known food or other triggers, ibuprofen helps but does not relieve the headache. Quiet room helps. No vision changes. No aura. No other focal neurologic deficits, associated symptoms, modifiable factors.    REVIEW OF SYSTEMS: Full 14 system review of systems performed and notable only for those listed, all others are neg:  Constitutional: neg  Cardiovascular:  neg Ear/Nose/Throat: neg  Skin: neg Eyes: neg Respiratory: neg Gastroitestinal: neg  Hematology/Lymphatic: neg  Endocrine: neg Musculoskeletal:neg Allergy/Immunology: neg Neurological: neg Psychiatric: neg Sleep : neg   ALLERGIES: No Known Allergies  HOME MEDICATIONS: Outpatient Medications Prior to Visit  Medication Sig Dispense Refill  . Cholecalciferol (VITAMIN D PO) Take 50,000 Units by mouth once a week.    . eletriptan (RELPAX) 40 MG tablet Take 1 tablet (40 mg total) by mouth as needed for migraine or headache. May repeat in 2 hours if headache persists or recurs. 10 tablet 11  . ibuprofen (ADVIL,MOTRIN) 800 MG tablet Take 800 mg by mouth 3 (three) times daily as needed.    . Multiple Vitamins-Minerals (MULTIVITAMIN PO) Take 1 tablet by mouth daily.    . ondansetron (ZOFRAN) 4 MG tablet Take 4 mg by mouth every 8 (eight) hours as needed for nausea or vomiting.    . propranolol ER (INDERAL LA) 60 MG 24 hr capsule Take 1 capsule (60 mg total) by mouth at bedtime. FOR FURTHER REFILLS CALL [phone] 30 capsule 0  . topiramate (TOPAMAX) 100 MG tablet Take 1 tablet (100 mg total) by mouth daily. 90 tablet 4  . venlafaxine XR (EFFEXOR-XR) 150 MG 24 hr capsule Take 1 capsule (150 mg total) by mouth daily with breakfast. FOR FURTHER REFILLS CALL [phone] 30 capsule 0   No facility-administered medications prior to visit.     PAST MEDICAL HISTORY: Past Medical History:  Diagnosis Date  . Anxiety   . Migraine   . Obesity     PAST SURGICAL HISTORY: Past Surgical History:  Procedure Laterality Date  .  VAGINAL HYSTERECTOMY  2005   Still has ovaries    FAMILY HISTORY: Family History  Problem Relation Age of Onset  . Anemia Mother   . Bladder Cancer Father   . Asthma Brother   . Ovarian cancer Paternal Grandmother     SOCIAL HISTORY: Social History   Socioeconomic History  . Marital status: Married    Spouse name: Berneta Sages  . Number of children: 2  . Years  of education: 80  . Highest education level: Not on file  Occupational History  . Occupation: Nurse- LPN  Social Needs  . Financial resource strain: Not on file  . Food insecurity:    Worry: Not on file    Inability: Not on file  . Transportation needs:    Medical: Not on file    Non-medical: Not on file  Tobacco Use  . Smoking status: Never Smoker  . Smokeless tobacco: Never Used  Substance and Sexual Activity  . Alcohol use: Yes    Comment: Socially  . Drug use: No  . Sexual activity: Not on file  Lifestyle  . Physical activity:    Days per week: Not on file    Minutes per session: Not on file  . Stress: Not on file  Relationships  . Social connections:    Talks on phone: Not on file    Gets together: Not on file    Attends religious service: Not on file    Active member of club or organization: Not on file    Attends meetings of clubs or organizations: Not on file    Relationship status: Not on file  . Intimate partner violence:    Fear of current or ex partner: Not on file    Emotionally abused: Not on file    Physically abused: Not on file    Forced sexual activity: Not on file  Other Topics Concern  . Not on file  Social History Narrative   Lives with husband and 2 kids   Caffeine use: Tea/soda- occas   Right-handed     PHYSICAL EXAM  There were no vitals filed for this visit. There is no height or weight on file to calculate BMI.  Generalized: Well developed, in no acute distress  Head: normocephalic and atraumatic,. Oropharynx benign  Neck: Supple, no carotid bruits  Cardiac: Regular rate rhythm, no murmur  Musculoskeletal: No deformity   Neurological examination   Mentation: Alert oriented to time, place, history taking. Attention span and concentration appropriate. Recent and remote memory intact.  Follows all commands speech and language fluent.   Cranial nerve II-XII: Fundoscopic exam reveals sharp disc margins.Pupils were equal round reactive  to light extraocular movements were full, visual field were full on confrontational test. Facial sensation and strength were normal. hearing was intact to finger rubbing bilaterally. Uvula tongue midline. head turning and shoulder shrug were normal and symmetric.Tongue protrusion into cheek strength was normal. Motor: normal bulk and tone, full strength in the BUE, BLE, fine finger movements normal, no pronator drift. No focal weakness Sensory: normal and symmetric to light touch, pinprick, and  Vibration, proprioception  Coordination: finger-nose-finger, heel-to-shin bilaterally, no dysmetria Reflexes: Brachioradialis 2/2, biceps 2/2, triceps 2/2, patellar 2/2, Achilles 2/2, plantar responses were flexor bilaterally. Gait and Station: Rising up from seated position without assistance, normal stance,  moderate stride, good arm swing, smooth turning, able to perform tiptoe, and heel walking without difficulty. Tandem gait is steady  DIAGNOSTIC DATA (LABS, IMAGING, TESTING) - I reviewed patient  records, labs, notes, testing and imaging myself where available.  No results found for: WBC, HGB, HCT, MCV, PLT    Component Value Date/Time   NA 145 (H) 07/20/2016 1533   K 5.0 07/20/2016 1533   CL 106 07/20/2016 1533   CO2 23 07/20/2016 1533   GLUCOSE 91 07/20/2016 1533   BUN 16 07/20/2016 1533   CREATININE 1.18 (H) 07/20/2016 1533   CALCIUM 9.5 07/20/2016 1533   GFRNONAA 55 (L) 07/20/2016 1533   GFRAA 64 07/20/2016 1533    ASSESSMENT AND PLAN  47 y.o. year old female  has a past medical history of Anxiety, Migraine, and Obesity. here with *** 47 year old female with chronic migraine disorder, snoring, morning headaches, daytime fatigue. 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 Patient has not had reevaluation since 2012. Feel we should reevaluate MRI of the brain to follow  this lesion and ensure that there has been no worsening or enlargement.  - Continue propranolol for migraines. Continue Topamax and Effexor. Zofran for nausea. BMP today.  - repeat MRI of the brain, see above for differential: parietal lesion that was concerning for right parietal slow growing benign tumor    Dennie Bible, [facility], [facility], APRN  [facility] [address] [place], [place] [address] [phone]

## 2017-10-18 ENCOUNTER — Encounter: Payer: Self-pay | Admitting: Nurse Practitioner

## 2017-10-18 ENCOUNTER — Ambulatory Visit: Payer: PRIVATE HEALTH INSURANCE | Admitting: Nurse Practitioner

## 2017-10-18 NOTE — Progress Notes (Signed)
[facility]  PATIENT: ANITHA KREISER DOB: 1970/09/23   REASON FOR VISIT: Follow-up for migraine headaches HISTORY FROM: Patient   HISTORY OF PRESENT ILLNESS:UPDATE 6/25/2019CM Ms. Rotter, 47 year old female returns for follow-up with a history of migraine headaches currently well controlled with a combination of Inderal Topamax and Effexor .She has Relpax to take acutely.  Headaches have been well controlled.  MRI of the brain 08/02/2016 without acute findings.  Patient is not aware of any foods that are triggers.  Sleep study done 08/19/2016 did not show any significant obstructive sleep apnea.  Patient is reminded to maintain good sleep hygiene which means keep her regular sleep and wake schedule, exercise daily keep the bedroom cool and dark and conducive for  for sleep. she returns for reevaluation Interval history 07/20/2016:AA She snores a lot. She gets 8 hours of sleep and is still exhausted. The kids record her snoring. Morning headaches, wakes with headaches. The propranolol helped with the migraines. Need to repeat MRI brain due to lesion seen in 2012. She has a sleep evaluation scheduled. Headaches improved but still with morning headaches.  FHL:KTGYB T Gilesis a 47 y.o.femalehere as a referral from Dr. Myrtie Hawk headaches. She has a Hx of migraines. Sjhe is on topamax and effexor. Recently worsened. Within the last month worsening, no inciting events to worsening. Headaches are pounding in the forehead across the front. She has had daily headaches in the setting of elevated blood pressure (638L systolic) but may be due to the headache painas opposed to hypertension. The headaches develop later in the day. She has nausea, no vomiting. She has had light sensitivity. Pounding and throbbing can be severe a 10/10 she has had to leave work and get a toradol injection and phenergan. One migraine can last all day. She tried imitrex in the past and it made her sick. Can't  remember if it worked. She has gained a lot of weight. She has dizziness and lightheadedness associated with the headaches. She snores heavily. Previous to this month was having 4 headache days a month, so this month is significantly worsened. No new medications, no changes in sleeping or work habits, no known food or other triggers, ibuprofen helps but does not relieve the headache. Quiet room helps. No vision changes. No aura. No other focal neurologic deficits, associated symptoms, modifiable factors.    REVIEW OF SYSTEMS: Full 14 system review of systems performed and notable only for those listed, all others are neg:  Constitutional: neg  Cardiovascular: neg Ear/Nose/Throat: neg  Skin: neg Eyes: neg Respiratory: neg Gastroitestinal: neg  Hematology/Lymphatic: neg  Endocrine: neg Musculoskeletal:neg Allergy/Immunology: neg Neurological: History of migraine headaches Psychiatric: neg Sleep : neg   ALLERGIES: No Known Allergies  HOME MEDICATIONS: Outpatient Medications Prior to Visit  Medication Sig Dispense Refill  . Cholecalciferol (VITAMIN D PO) Take 50,000 Units by mouth once a week.    . eletriptan (RELPAX) 40 MG tablet Take 1 tablet (40 mg total) by mouth as needed for migraine or headache. May repeat in 2 hours if headache persists or recurs. 10 tablet 11  . ibuprofen (ADVIL,MOTRIN) 800 MG tablet Take 800 mg by mouth 3 (three) times daily as needed.    . Multiple Vitamins-Minerals (MULTIVITAMIN PO) Take 1 tablet by mouth daily.    . ondansetron (ZOFRAN) 4 MG tablet Take 4 mg by mouth every 8 (eight) hours as needed for nausea or vomiting.    . topiramate (TOPAMAX) 100 MG tablet Take 1 tablet (100 mg  total) by mouth daily. 90 tablet 4  . propranolol ER (INDERAL LA) 60 MG 24 hr capsule Take 1 capsule (60 mg total) by mouth at bedtime. FOR FURTHER REFILLS CALL [phone] (Patient not taking: Reported on 10/19/2017) 30 capsule 0  . venlafaxine XR (EFFEXOR-XR) 150 MG 24 hr  capsule Take 1 capsule (150 mg total) by mouth daily with breakfast. FOR FURTHER REFILLS CALL [phone] (Patient not taking: Reported on 10/19/2017) 30 capsule 0   No facility-administered medications prior to visit.     PAST MEDICAL HISTORY: Past Medical History:  Diagnosis Date  . Anxiety   . Migraine   . Obesity     PAST SURGICAL HISTORY: Past Surgical History:  Procedure Laterality Date  . VAGINAL HYSTERECTOMY  2005   Still has ovaries    FAMILY HISTORY: Family History  Problem Relation Age of Onset  . Anemia Mother   . Bladder Cancer Father   . Asthma Brother   . Ovarian cancer Paternal Grandmother     SOCIAL HISTORY: Social History   Socioeconomic History  . Marital status: Married    Spouse name: Berneta Sages  . Number of children: 2  . Years of education: 22  . Highest education level: Not on file  Occupational History  . Occupation: Nurse- LPN  Social Needs  . Financial resource strain: Not on file  . Food insecurity:    Worry: Not on file    Inability: Not on file  . Transportation needs:    Medical: Not on file    Non-medical: Not on file  Tobacco Use  . Smoking status: Never Smoker  . Smokeless tobacco: Never Used  Substance and Sexual Activity  . Alcohol use: Yes    Comment: Socially  . Drug use: No  . Sexual activity: Not on file  Lifestyle  . Physical activity:    Days per week: Not on file    Minutes per session: Not on file  . Stress: Not on file  Relationships  . Social connections:    Talks on phone: Not on file    Gets together: Not on file    Attends religious service: Not on file    Active member of club or organization: Not on file    Attends meetings of clubs or organizations: Not on file    Relationship status: Not on file  . Intimate partner violence:    Fear of current or ex partner: Not on file    Emotionally abused: Not on file    Physically abused: Not on file    Forced sexual activity: Not on file  Other Topics  Concern  . Not on file  Social History Narrative   Lives with husband and 2 kids   Caffeine use: Tea/soda- occas   Right-handed     PHYSICAL EXAM  Vitals:   10/19/17 1055  BP: 121/83  Pulse: 69  Weight: 190 lb 12.8 oz (86.5 kg)  Height: 5' 1.5" (1.562 m)   Body mass index is 35.47 kg/m.  Generalized: Well developed, obese female  in no acute distress  Head: normocephalic and atraumatic,. Oropharynx benign  Neck: Supple,  Musculoskeletal: No deformity   Neurological examination   Mentation: Alert oriented to time, place, history taking. Attention span and concentration appropriate. Recent and remote memory intact.  Follows all commands speech and language fluent.   Cranial nerve II-XII: Pupils were equal round reactive to light extraocular movements were full, visual field were full on confrontational test. Facial sensation and  strength were normal. hearing was intact to finger rubbing bilaterally. Uvula tongue midline. head turning and shoulder shrug were normal and symmetric.Tongue protrusion into cheek strength was normal. Motor: normal bulk and tone, full strength in the BUE, BLE, fine finger movements normal, no pronator drift. No focal weakness Sensory: normal and symmetric to light touch,  Coordination: finger-nose-finger, heel-to-shin bilaterally, no dysmetria Reflexes: Symmetric upper and lower plantar responses were flexor bilaterally. Gait and Station: Rising up from seated position without assistance, normal stance,  moderate stride, good arm swing, smooth turning, able to perform tiptoe, and heel walking without difficulty. Tandem gait is steady  DIAGNOSTIC DATA (LABS, IMAGING, TESTING) - I reviewed patient records, labs, notes, testing and imaging myself where available.      Component Value Date/Time   NA 145 (H) 07/20/2016 1533   K 5.0 07/20/2016 1533   CL 106 07/20/2016 1533   CO2 23 07/20/2016 1533   GLUCOSE 91 07/20/2016 1533   BUN 16 07/20/2016 1533     CREATININE 1.18 (H) 07/20/2016 1533   CALCIUM 9.5 07/20/2016 1533   GFRNONAA 55 (L) 07/20/2016 1533   GFRAA 64 07/20/2016 1533    ASSESSMENT AND PLAN  47 year old female with chronic migraine disorder, snoring, morning headaches, daytime fatigue.  Sleep study done 08/19/2016 did not show any significant obstructive sleep apnea.MRI of the brain 08/02/2016 without acute findings.   PLAN: Continue propanolol 60 mg at bedtime Continue Topamax 100 mg daily Continue Effexor 150 daily Will refill meds Discussed common foods that her migraine triggers and given a copy for reference later  follow-up yearly and as needed Dennie Bible, [facility], [facility], APRN  [facility] [address] [place], [place] [address] [phone]

## 2017-10-19 ENCOUNTER — Encounter: Payer: Self-pay | Admitting: Nurse Practitioner

## 2017-10-19 ENCOUNTER — Ambulatory Visit: Payer: PRIVATE HEALTH INSURANCE | Admitting: Nurse Practitioner

## 2017-10-19 VITALS — BP 121/83 | HR 69 | Ht 61.5 in | Wt 190.8 lb

## 2017-10-19 DIAGNOSIS — G43009 Migraine without aura, not intractable, without status migrainosus: Secondary | ICD-10-CM

## 2017-10-19 MED ORDER — VENLAFAXINE HCL ER 150 MG PO CP24: 150.0000 mg | ORAL_CAPSULE | Freq: Every day | ORAL | 3 refills | Status: AC

## 2017-10-19 MED ORDER — ONDANSETRON HCL 4 MG PO TABS: 4.0000 mg | ORAL_TABLET | Freq: Three times a day (TID) | ORAL | 0 refills | Status: AC | PRN

## 2017-10-19 MED ORDER — TOPIRAMATE 100 MG PO TABS: 100.0000 mg | ORAL_TABLET | Freq: Every day | ORAL | 3 refills | Status: AC

## 2017-10-19 MED ORDER — ELETRIPTAN HYDROBROMIDE 40 MG PO TABS
40.0000 mg | ORAL_TABLET | ORAL | 11 refills | Status: AC | PRN
Start: 2017-10-19 — End: ?

## 2017-10-19 MED ORDER — PROPRANOLOL HCL ER 60 MG PO CP24: 60.0000 mg | ORAL_CAPSULE | Freq: Every day | ORAL | 3 refills | Status: DC

## 2017-10-19 NOTE — Patient Instructions (Signed)
Continue propanolol 60 mg at bedtime Continue Topamax 100 mg daily Continue Effexor 150 daily Will refill meds Follow-up yearly and as needed

## 2017-10-20 NOTE — Progress Notes (Signed)
Personally  participated in, made any corrections needed, and agree with history, physical, neuro exam,assessment and plan as stated above.    Antonia Ahern, MD [facility] 

## 2018-06-27 ENCOUNTER — Encounter: Payer: Self-pay | Admitting: Nurse Practitioner

## 2018-10-25 ENCOUNTER — Ambulatory Visit: Payer: PRIVATE HEALTH INSURANCE | Admitting: Nurse Practitioner

## 2018-12-19 ENCOUNTER — Other Ambulatory Visit: Payer: Self-pay

## 2018-12-19 DIAGNOSIS — G43009 Migraine without aura, not intractable, without status migrainosus: Secondary | ICD-10-CM

## 2018-12-19 MED ORDER — PROPRANOLOL HCL ER 60 MG PO CP24: 60.0000 mg | ORAL_CAPSULE | Freq: Every day | ORAL | 3 refills | Status: AC

## 2018-12-19 NOTE — Telephone Encounter (Signed)
Received a fax from [place] where the patient is requesting a refill on propranolol ER (INDERAL LA) 60 MG 24 hr capsule.

## 2019-12-31 ENCOUNTER — Other Ambulatory Visit: Payer: Self-pay | Admitting: Neurology

## 2019-12-31 DIAGNOSIS — G43009 Migraine without aura, not intractable, without status migrainosus: Secondary | ICD-10-CM

## 2020-01-04 ENCOUNTER — Other Ambulatory Visit: Payer: Self-pay | Admitting: Neurology

## 2020-01-04 DIAGNOSIS — G43009 Migraine without aura, not intractable, without status migrainosus: Secondary | ICD-10-CM

## 2020-10-02 DIAGNOSIS — Z1231 Encounter for screening mammogram for malignant neoplasm of breast: Secondary | ICD-10-CM | POA: Diagnosis not present

## 2020-10-02 DIAGNOSIS — Z6835 Body mass index (BMI) 35.0-35.9, adult: Secondary | ICD-10-CM | POA: Diagnosis not present

## 2020-10-02 DIAGNOSIS — Z01419 Encounter for gynecological examination (general) (routine) without abnormal findings: Secondary | ICD-10-CM | POA: Diagnosis not present

## 2020-10-10 DIAGNOSIS — Z1211 Encounter for screening for malignant neoplasm of colon: Secondary | ICD-10-CM | POA: Diagnosis not present

## 2021-02-07 DIAGNOSIS — G43909 Migraine, unspecified, not intractable, without status migrainosus: Secondary | ICD-10-CM | POA: Diagnosis not present

## 2021-02-07 DIAGNOSIS — E669 Obesity, unspecified: Secondary | ICD-10-CM | POA: Diagnosis not present

## 2021-02-07 DIAGNOSIS — E559 Vitamin D deficiency, unspecified: Secondary | ICD-10-CM | POA: Diagnosis not present

## 2021-02-07 DIAGNOSIS — F419 Anxiety disorder, unspecified: Secondary | ICD-10-CM | POA: Diagnosis not present

## 2022-02-17 ENCOUNTER — Other Ambulatory Visit (HOSPITAL_COMMUNITY): Payer: Self-pay | Admitting: Family Medicine

## 2022-02-17 ENCOUNTER — Other Ambulatory Visit: Payer: Self-pay | Admitting: Family Medicine

## 2022-02-17 DIAGNOSIS — E7800 Pure hypercholesterolemia, unspecified: Secondary | ICD-10-CM

## 2022-02-27 ENCOUNTER — Ambulatory Visit (HOSPITAL_COMMUNITY)
Admission: RE | Admit: 2022-02-27 | Discharge: 2022-02-27 | Disposition: A | Discharge status: Home / Self Care | Payer: PRIVATE HEALTH INSURANCE | Source: Ambulatory Visit | Attending: Family Medicine | Admitting: Family Medicine

## 2022-02-27 ENCOUNTER — Encounter (HOSPITAL_COMMUNITY): Payer: Self-pay

## 2022-02-27 DIAGNOSIS — E7800 Pure hypercholesterolemia, unspecified: Secondary | ICD-10-CM | POA: Insufficient documentation

## 2022-05-01 ENCOUNTER — Other Ambulatory Visit: Payer: Self-pay | Admitting: Family Medicine

## 2022-05-01 ENCOUNTER — Ambulatory Visit
Admission: RE | Admit: 2022-05-01 | Discharge: 2022-05-01 | Disposition: A | Discharge status: Home / Self Care | Payer: 59 | Source: Ambulatory Visit | Attending: Family Medicine | Admitting: Family Medicine

## 2022-05-01 DIAGNOSIS — M25511 Pain in right shoulder: Secondary | ICD-10-CM

## 2022-06-25 ENCOUNTER — Other Ambulatory Visit (HOSPITAL_COMMUNITY): Payer: Self-pay

## 2022-06-25 MED ORDER — ZEPBOUND 2.5 MG/0.5ML ~~LOC~~ SOAJ
SUBCUTANEOUS | 0 refills | Status: AC
  Filled 2022-06-25: qty 2, 28d supply, fill #0

## 2022-06-25 MED ORDER — WEGOVY 0.5 MG/0.5ML ~~LOC~~ SOAJ
SUBCUTANEOUS | 0 refills | Status: AC
  Filled 2022-06-25: qty 2, 28d supply, fill #0

## 2023-10-04 DIAGNOSIS — R0981 Nasal congestion: Secondary | ICD-10-CM | POA: Diagnosis not present

## 2023-10-04 DIAGNOSIS — R051 Acute cough: Secondary | ICD-10-CM | POA: Diagnosis not present

## 2023-10-04 DIAGNOSIS — R509 Fever, unspecified: Secondary | ICD-10-CM | POA: Diagnosis not present

## 2023-12-22 DIAGNOSIS — Z01419 Encounter for gynecological examination (general) (routine) without abnormal findings: Secondary | ICD-10-CM | POA: Diagnosis not present

## 2023-12-22 DIAGNOSIS — Z6837 Body mass index (BMI) 37.0-37.9, adult: Secondary | ICD-10-CM | POA: Diagnosis not present

## 2023-12-22 DIAGNOSIS — Z1231 Encounter for screening mammogram for malignant neoplasm of breast: Secondary | ICD-10-CM | POA: Diagnosis not present

## 2024-01-05 DIAGNOSIS — H6123 Impacted cerumen, bilateral: Secondary | ICD-10-CM | POA: Diagnosis not present

## 2024-02-07 DIAGNOSIS — G43909 Migraine, unspecified, not intractable, without status migrainosus: Secondary | ICD-10-CM | POA: Diagnosis not present

## 2024-02-07 DIAGNOSIS — E785 Hyperlipidemia, unspecified: Secondary | ICD-10-CM | POA: Diagnosis not present

## 2024-02-07 DIAGNOSIS — Z79899 Other long term (current) drug therapy: Secondary | ICD-10-CM | POA: Diagnosis not present

## 2024-02-07 DIAGNOSIS — G4733 Obstructive sleep apnea (adult) (pediatric): Secondary | ICD-10-CM | POA: Diagnosis not present

## 2024-02-07 DIAGNOSIS — E559 Vitamin D deficiency, unspecified: Secondary | ICD-10-CM | POA: Diagnosis not present

## 2024-03-15 DIAGNOSIS — G4733 Obstructive sleep apnea (adult) (pediatric): Secondary | ICD-10-CM | POA: Diagnosis not present

## 2024-03-31 DIAGNOSIS — Z6837 Body mass index (BMI) 37.0-37.9, adult: Secondary | ICD-10-CM | POA: Diagnosis not present

## 2024-03-31 DIAGNOSIS — E669 Obesity, unspecified: Secondary | ICD-10-CM | POA: Diagnosis not present

## 2024-04-04 DIAGNOSIS — G4733 Obstructive sleep apnea (adult) (pediatric): Secondary | ICD-10-CM | POA: Diagnosis not present
